# Patient Record
Sex: Male | Born: 1985 | Race: White | Hispanic: No | Marital: Single | Smoking: Former smoker
Health system: Southern US, Community
[De-identification: ages and names within clinical notes are randomized; demographics above are authoritative.]

## PROBLEM LIST (undated history)

## (undated) DIAGNOSIS — I8390 Asymptomatic varicose veins of unspecified lower extremity: Secondary | ICD-10-CM

## (undated) DIAGNOSIS — N183 Chronic kidney disease, stage 3 unspecified: Secondary | ICD-10-CM

## (undated) DIAGNOSIS — I10 Essential (primary) hypertension: Secondary | ICD-10-CM

## (undated) DIAGNOSIS — K5902 Outlet dysfunction constipation: Secondary | ICD-10-CM

## (undated) DIAGNOSIS — Q423 Congenital absence, atresia and stenosis of anus without fistula: Secondary | ICD-10-CM

## (undated) DIAGNOSIS — D899 Disorder involving the immune mechanism, unspecified: Secondary | ICD-10-CM

## (undated) DIAGNOSIS — Z9289 Personal history of other medical treatment: Secondary | ICD-10-CM

## (undated) DIAGNOSIS — J189 Pneumonia, unspecified organism: Secondary | ICD-10-CM

## (undated) DIAGNOSIS — Q605 Renal hypoplasia, unspecified: Secondary | ICD-10-CM

## (undated) DIAGNOSIS — Z940 Kidney transplant status: Secondary | ICD-10-CM

## (undated) DIAGNOSIS — Q7959 Other congenital malformations of abdominal wall: Secondary | ICD-10-CM

## (undated) DIAGNOSIS — E213 Hyperparathyroidism, unspecified: Secondary | ICD-10-CM

## (undated) DIAGNOSIS — Q602 Renal agenesis, unspecified: Secondary | ICD-10-CM

## (undated) DIAGNOSIS — D849 Immunodeficiency, unspecified: Secondary | ICD-10-CM

## (undated) HISTORY — PX: COLOSTOMY: SHX63

## (undated) HISTORY — PX: REPAIR IMPERFORATE ANUS / ANORECTOPLASTY: SUR1185

## (undated) HISTORY — PX: PYELOTOMY / PYELOSTOMY: SUR1062

## (undated) HISTORY — PX: APPENDECTOMY: SHX54

## (undated) HISTORY — PX: COLOSTOMY TAKEDOWN: SHX5783

---
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
## 2010-07-13 HISTORY — PX: PARATHYROIDECTOMY: SHX19

## 2013-01-31 ENCOUNTER — Emergency Department (HOSPITAL_COMMUNITY): Payer: BC Managed Care – PPO

## 2013-01-31 ENCOUNTER — Inpatient Hospital Stay (HOSPITAL_COMMUNITY)
Admission: EM | Admit: 2013-01-31 | Discharge: 2013-02-03 | DRG: 584 | Disposition: A | Discharge status: Home / Self Care | Payer: BC Managed Care – PPO | Attending: Internal Medicine | Admitting: Internal Medicine

## 2013-01-31 ENCOUNTER — Encounter (HOSPITAL_COMMUNITY): Payer: Self-pay | Admitting: Emergency Medicine

## 2013-01-31 DIAGNOSIS — E213 Hyperparathyroidism, unspecified: Secondary | ICD-10-CM | POA: Diagnosis present

## 2013-01-31 DIAGNOSIS — M1A00X0 Idiopathic chronic gout, unspecified site, without tophus (tophi): Secondary | ICD-10-CM | POA: Diagnosis present

## 2013-01-31 DIAGNOSIS — I498 Other specified cardiac arrhythmias: Secondary | ICD-10-CM | POA: Diagnosis present

## 2013-01-31 DIAGNOSIS — N390 Urinary tract infection, site not specified: Secondary | ICD-10-CM

## 2013-01-31 DIAGNOSIS — Z940 Kidney transplant status: Secondary | ICD-10-CM

## 2013-01-31 DIAGNOSIS — Z79899 Other long term (current) drug therapy: Secondary | ICD-10-CM

## 2013-01-31 DIAGNOSIS — I10 Essential (primary) hypertension: Secondary | ICD-10-CM | POA: Diagnosis present

## 2013-01-31 DIAGNOSIS — K5902 Outlet dysfunction constipation: Secondary | ICD-10-CM

## 2013-01-31 DIAGNOSIS — N183 Chronic kidney disease, stage 3 unspecified: Secondary | ICD-10-CM | POA: Diagnosis present

## 2013-01-31 DIAGNOSIS — K5900 Constipation, unspecified: Secondary | ICD-10-CM | POA: Diagnosis present

## 2013-01-31 DIAGNOSIS — M109 Gout, unspecified: Secondary | ICD-10-CM | POA: Diagnosis present

## 2013-01-31 DIAGNOSIS — R509 Fever, unspecified: Secondary | ICD-10-CM

## 2013-01-31 DIAGNOSIS — A419 Sepsis, unspecified organism: Principal | ICD-10-CM

## 2013-01-31 DIAGNOSIS — N12 Tubulo-interstitial nephritis, not specified as acute or chronic: Secondary | ICD-10-CM | POA: Diagnosis present

## 2013-01-31 DIAGNOSIS — N179 Acute kidney failure, unspecified: Secondary | ICD-10-CM | POA: Diagnosis present

## 2013-01-31 DIAGNOSIS — D72829 Elevated white blood cell count, unspecified: Secondary | ICD-10-CM

## 2013-01-31 DIAGNOSIS — R000 Tachycardia, unspecified: Secondary | ICD-10-CM

## 2013-01-31 DIAGNOSIS — IMO0002 Reserved for concepts with insufficient information to code with codable children: Secondary | ICD-10-CM

## 2013-01-31 DIAGNOSIS — Q602 Renal agenesis, unspecified: Secondary | ICD-10-CM

## 2013-01-31 DIAGNOSIS — M1A9XX0 Chronic gout, unspecified, without tophus (tophi): Secondary | ICD-10-CM | POA: Diagnosis present

## 2013-01-31 DIAGNOSIS — Q421 Congenital absence, atresia and stenosis of rectum without fistula: Secondary | ICD-10-CM

## 2013-01-31 DIAGNOSIS — I129 Hypertensive chronic kidney disease with stage 1 through stage 4 chronic kidney disease, or unspecified chronic kidney disease: Secondary | ICD-10-CM | POA: Diagnosis present

## 2013-01-31 DIAGNOSIS — D649 Anemia, unspecified: Secondary | ICD-10-CM | POA: Diagnosis present

## 2013-01-31 DIAGNOSIS — Z87891 Personal history of nicotine dependence: Secondary | ICD-10-CM

## 2013-01-31 HISTORY — DX: Immunodeficiency, unspecified: D84.9

## 2013-01-31 HISTORY — DX: Pneumonia, unspecified organism: J18.9

## 2013-01-31 HISTORY — DX: Essential (primary) hypertension: I10

## 2013-01-31 HISTORY — DX: Renal hypoplasia, unspecified: Q60.5

## 2013-01-31 HISTORY — DX: Kidney transplant status: Z94.0

## 2013-01-31 HISTORY — DX: Outlet dysfunction constipation: K59.02

## 2013-01-31 HISTORY — DX: Chronic kidney disease, stage 3 (moderate): N18.3

## 2013-01-31 HISTORY — DX: Renal agenesis, unspecified: Q60.2

## 2013-01-31 HISTORY — DX: Disorder involving the immune mechanism, unspecified: D89.9

## 2013-01-31 HISTORY — DX: Personal history of other medical treatment: Z92.89

## 2013-01-31 HISTORY — DX: Hyperparathyroidism, unspecified: E21.3

## 2013-01-31 HISTORY — DX: Chronic kidney disease, stage 3 unspecified: N18.30

## 2013-01-31 HISTORY — DX: Asymptomatic varicose veins of unspecified lower extremity: I83.90

## 2013-01-31 HISTORY — DX: Congenital absence, atresia and stenosis of anus without fistula: Q42.3

## 2013-01-31 HISTORY — DX: Other congenital malformations of abdominal wall: Q79.59

## 2013-01-31 LAB — URINE MICROSCOPIC-ADD ON

## 2013-01-31 LAB — CBC WITH DIFFERENTIAL/PLATELET
Basophils Absolute: 0 10*3/uL (ref 0.0–0.1)
Eosinophils Absolute: 0.1 10*3/uL (ref 0.0–0.7)
HCT: 38.1 % — ABNORMAL LOW (ref 39.0–52.0)
Lymphs Abs: 0.8 10*3/uL (ref 0.7–4.0)
MCHC: 33.6 g/dL (ref 30.0–36.0)
MCV: 92 fL (ref 78.0–100.0)
Neutro Abs: 9.4 10*3/uL — ABNORMAL HIGH (ref 1.7–7.7)
RDW: 13.4 % (ref 11.5–15.5)

## 2013-01-31 LAB — COMPREHENSIVE METABOLIC PANEL
Alkaline Phosphatase: 64 U/L (ref 39–117)
BUN: 29 mg/dL — ABNORMAL HIGH (ref 6–23)
Calcium: 9.7 mg/dL (ref 8.4–10.5)
GFR calc Af Amer: 49 mL/min — ABNORMAL LOW (ref >90)
GFR calc non Af Amer: 42 mL/min — ABNORMAL LOW (ref >90)
Glucose, Bld: 98 mg/dL (ref 70–99)
Potassium: 4.1 mEq/L (ref 3.5–5.1)
Total Protein: 7.5 g/dL (ref 6.0–8.3)

## 2013-01-31 LAB — CBC
HCT: 35.2 % — ABNORMAL LOW (ref 39.0–52.0)
MCHC: 33.5 g/dL (ref 30.0–36.0)
Platelets: 211 10*3/uL (ref 150–400)
RDW: 13.6 % (ref 11.5–15.5)

## 2013-01-31 LAB — URINALYSIS, ROUTINE W REFLEX MICROSCOPIC
Ketones, ur: NEGATIVE mg/dL
Nitrite: POSITIVE — AB
Urobilinogen, UA: 0.2 mg/dL (ref 0.0–1.0)
pH: 5.5 (ref 5.0–8.0)

## 2013-01-31 LAB — CG4 I-STAT (LACTIC ACID): Lactic Acid, Venous: 1.1 mmol/L (ref 0.5–2.2)

## 2013-01-31 LAB — CREATININE, SERUM
Creatinine, Ser: 2.01 mg/dL — ABNORMAL HIGH (ref 0.50–1.35)
GFR calc non Af Amer: 43 mL/min — ABNORMAL LOW (ref >90)

## 2013-01-31 MED ORDER — PREDNISONE 10 MG PO TABS
10.0000 mg | ORAL_TABLET | Freq: Every day | ORAL | Status: DC
  Administered 2013-02-01 – 2013-02-03 (×3): 10 mg via ORAL
  Filled 2013-01-31 (×4): qty 1

## 2013-01-31 MED ORDER — PIPERACILLIN-TAZOBACTAM 3.375 G IVPB
3.3750 g | Freq: Once | INTRAVENOUS | Status: AC
  Administered 2013-01-31: 3.375 g via INTRAVENOUS
  Filled 2013-01-31: qty 50

## 2013-01-31 MED ORDER — VANCOMYCIN HCL IN DEXTROSE 1-5 GM/200ML-% IV SOLN
1000.0000 mg | Freq: Once | INTRAVENOUS | Status: DC
  Filled 2013-01-31: qty 200

## 2013-01-31 MED ORDER — SODIUM CHLORIDE 0.9 % IV BOLUS (SEPSIS)
1000.0000 mL | Freq: Once | INTRAVENOUS | Status: AC
  Administered 2013-01-31: 1000 mL via INTRAVENOUS

## 2013-01-31 MED ORDER — SULFAMETHOXAZOLE-TRIMETHOPRIM 800-160 MG PO TABS
1.0000 | ORAL_TABLET | Freq: Once | ORAL | Status: DC
  Filled 2013-01-31: qty 1

## 2013-01-31 MED ORDER — ACETAMINOPHEN 325 MG PO TABS
650.0000 mg | ORAL_TABLET | Freq: Four times a day (QID) | ORAL | Status: DC | PRN
  Administered 2013-02-01: 650 mg via ORAL
  Filled 2013-01-31: qty 2

## 2013-01-31 MED ORDER — ONDANSETRON HCL 4 MG/2ML IJ SOLN: 4.0000 mg | Freq: Four times a day (QID) | INTRAMUSCULAR | Status: DC | PRN

## 2013-01-31 MED ORDER — PIPERACILLIN-TAZOBACTAM 3.375 G IVPB
3.3750 g | Freq: Three times a day (TID) | INTRAVENOUS | Status: DC
  Administered 2013-01-31 – 2013-02-02 (×6): 3.375 g via INTRAVENOUS
  Filled 2013-01-31 (×8): qty 50

## 2013-01-31 MED ORDER — LOSARTAN POTASSIUM 25 MG PO TABS
25.0000 mg | ORAL_TABLET | Freq: Every day | ORAL | Status: DC
  Administered 2013-01-31 – 2013-02-03 (×4): 25 mg via ORAL
  Filled 2013-01-31 (×4): qty 1

## 2013-01-31 MED ORDER — ENOXAPARIN SODIUM 40 MG/0.4ML ~~LOC~~ SOLN
40.0000 mg | SUBCUTANEOUS | Status: DC
  Administered 2013-01-31 – 2013-02-02 (×3): 40 mg via SUBCUTANEOUS
  Filled 2013-01-31 (×4): qty 0.4

## 2013-01-31 MED ORDER — TACROLIMUS 1 MG PO CAPS
4.0000 mg | ORAL_CAPSULE | Freq: Every day | ORAL | Status: DC
  Administered 2013-01-31 – 2013-02-02 (×3): 4 mg via ORAL
  Filled 2013-01-31 (×4): qty 4

## 2013-01-31 MED ORDER — ACETAMINOPHEN 650 MG RE SUPP: 650.0000 mg | Freq: Four times a day (QID) | RECTAL | Status: DC | PRN

## 2013-01-31 MED ORDER — SODIUM BICARBONATE 650 MG PO TABS
1300.0000 mg | ORAL_TABLET | Freq: Every day | ORAL | Status: DC
Start: 2013-01-31 — End: 2013-02-03
  Administered 2013-01-31 – 2013-02-03 (×4): 1300 mg via ORAL
  Filled 2013-01-31 (×4): qty 2

## 2013-01-31 MED ORDER — MYCOPHENOLATE MOFETIL 250 MG PO CAPS
1500.0000 mg | ORAL_CAPSULE | Freq: Two times a day (BID) | ORAL | Status: DC
  Administered 2013-01-31 – 2013-02-03 (×6): 1500 mg via ORAL
  Filled 2013-01-31 (×8): qty 6

## 2013-01-31 MED ORDER — SODIUM CHLORIDE 0.9 % IV SOLN
INTRAVENOUS | Status: AC
  Administered 2013-01-31 – 2013-02-01 (×2): via INTRAVENOUS

## 2013-01-31 MED ORDER — ALLOPURINOL 100 MG PO TABS
100.0000 mg | ORAL_TABLET | Freq: Every day | ORAL | Status: DC
  Administered 2013-02-01 – 2013-02-03 (×3): 100 mg via ORAL
  Filled 2013-01-31 (×4): qty 1

## 2013-01-31 MED ORDER — METOPROLOL TARTRATE 12.5 MG HALF TABLET
12.5000 mg | ORAL_TABLET | Freq: Two times a day (BID) | ORAL | Status: DC
  Administered 2013-01-31 – 2013-02-03 (×6): 12.5 mg via ORAL
  Filled 2013-01-31 (×7): qty 1

## 2013-01-31 MED ORDER — ONDANSETRON HCL 4 MG PO TABS: 4.0000 mg | ORAL_TABLET | Freq: Four times a day (QID) | ORAL | Status: DC | PRN

## 2013-01-31 MED ORDER — SODIUM CHLORIDE 0.9 % IV SOLN
INTRAVENOUS | Status: DC
  Administered 2013-01-31: 17:00:00 via INTRAVENOUS

## 2013-01-31 MED ORDER — ACETAMINOPHEN 325 MG PO TABS
650.0000 mg | ORAL_TABLET | Freq: Once | ORAL | Status: AC
  Administered 2013-01-31: 650 mg via ORAL
  Filled 2013-01-31: qty 2

## 2013-01-31 MED ORDER — TACROLIMUS 1 MG PO CAPS
3.0000 mg | ORAL_CAPSULE | Freq: Every day | ORAL | Status: DC
  Administered 2013-02-01 – 2013-02-03 (×3): 3 mg via ORAL
  Filled 2013-01-31 (×3): qty 3

## 2013-01-31 MED ORDER — SODIUM CHLORIDE 0.9 % IJ SOLN: 3.0000 mL | Freq: Two times a day (BID) | INTRAMUSCULAR | Status: DC

## 2013-01-31 NOTE — H&P (Signed)
[facility] History and Physical  Robert Bartlett NWG:[id number] DOB: 10-21-1985 DOA: 01/31/2013  Referring physician:  Loren Racer PCP:  No primary provider on file.    [facility], Dr. Rennis Harding, 2 weeks appointment already scheduled, new patient visit.  Chief Complaint:  Fever, back pain  HPI:  The patient is a 27 y.o. 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 Get a second kidney transplant, his donor was his mother, and developed rejection in 2012 which responded to Atgam, and he has been compliant with his immunosuppression.  His baseline creatinine is around 1.8 and he has had recurrent UTIs for the last 5 years.  He states his urinary tract infections usually respond to fluoroquinolone. His medical history also includes hypertension, hyperparathyroidism status post removal of one parathyroid gland, rectal dysmotility requiring daily enema a rectal disimpaction.    The patient states that he was at his baseline health 3 days ago. He developed some low back pain which got progressively worse. He developed high fevers to 101F this morning. He also had diffuse myalgias. He denies nausea, vomiting, diarrhea, or abdominal pain. He he firmly denies that he has had pain over his transplant. He has had dysuria without urgency or frequency. He has had some cough for the last 2 days without production of sputum or sinus symptoms. He denies sore throat or ear pain.  He states his symptoms are similar to when he had urinary tract infections in the past.  In the emergency department, he was tachycardic to the 140s and his heart rate trended down with IV fluids. His temperature was 101  Fahrenheit in trended down with Tylenol.  His labs demonstrates BUN of 29, creatinine 2.07, potassium 4.1, bicarbonate 20, white blood cell count 11.5, hemoglobin 12.8.  Urinalysis positive for greater than 300 protein, positive nitrite, large leukocyte esterase, too numerous to count white blood cells, rare squamous epithelials, and many bacteria.   Review of Systems:  Denies weight loss or gain, changes to hearing and vision.  Denies rhinorrhea, sinus congestion, sore throat.  Denies chest pain and palpitations.  Denies SOB, wheezing.  Denies nausea, vomiting, diarrhea.  Denies polyuria, polydipsia.  Denies hematemesis, blood in stools, melena, abnormal bruising or bleeding.  Denies lymphadenopathy.  Occasional right angle arthralgias.  Denies skin rash or ulcer.  Chronic mild edema of right lower extremity edema.  Denies focal numbness, weakness, slurred speech, confusion, facial droop.  Denies anxiety and depression.    Past Medical History  Diagnosis Date  . H/O kidney transplant     1st in 1995, rejected and failed.  2nd in 2003 from mother still working.  Had rejection two years ago Atgam which stopped rejection  . Hypertension   . Hyperparathyroidism     removed 1 parathyroid gland with resolution  . 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 Imperforate anus   . Congenital absence of abdominal musculature   . Congenital renal agenesis and dysgenesis   . Chronic kidney disease, stage 3   . Gout   . Immunosuppression    Past Surgical History  Procedure Laterality Date  . Repair imperforate anus /  anorectoplasty    . Kidney transplant  1995  . Kidney transplant  2003    living donor, mother  . Tonsillectomy    . Appendectomy    . Pyelotomy / pyelostomy      s/p take down in 2003  . Colostomy      s/p reversal   Social History:  reports that he quit smoking about 10 years ago. His smoking use included Cigarettes. He smoked 0.10  packs per day. He does not have any smokeless tobacco history on file. He reports that  drinks alcohol. He reports that he does not use illicit drugs. Lives in a house in [place].  Does not get any home health services.  Completes ADLs without assistance.    Allergies  Allergen Reactions  . Aspirin     Can not take due to kidney transplant  . Vancomycin     Vein redness and swelling on past administration  . Azithromycin Rash    Family History  Problem Relation Age of Onset  . High blood pressure Father   . Stroke Paternal Grandfather   . Kidney failure Neg Hx      Prior to Admission medications   Medication Sig Start Date End Date Taking? Authorizing Provider  allopurinol (ZYLOPRIM) 100 MG tablet Take 100 mg by mouth daily.   Yes Historical Provider, MD  losartan (COZAAR) 25 MG tablet Take 25 mg by mouth daily.   Yes Historical Provider, MD  metoprolol tartrate (LOPRESSOR) 25 MG tablet Take 12.5 mg by mouth daily.   Yes Historical Provider, MD  mycophenolate (CELLCEPT) 500 MG tablet Take 1,500 mg by mouth 2 (two) times daily.   Yes Historical Provider, MD  predniSONE (DELTASONE) 10 MG tablet Take 10 mg by mouth daily.   Yes Historical Provider, MD  sodium bicarbonate 650 MG tablet Take 1,300 mg by mouth daily.   Yes Historical Provider, MD  sulfamethoxazole-trimethoprim (BACTRIM DS,SEPTRA DS) 800-160 MG per tablet Take 1 tablet by mouth once. Monday,wed,friday   Yes Historical Provider, MD  tacrolimus (PROGRAF) 1 MG capsule Take 3-4 mg by mouth 2 (two) times daily. Take 3mg  in the morning and 4mg  in the evening   Yes Historical Provider, MD   Physical Exam: Filed Vitals:   01/31/13 1348 01/31/13 1408 01/31/13 1515 01/31/13 1657  BP: 128/95 121/78  116/75  Pulse: 144 142  90  Temp: 100.4 F (38 C)  101 F (38.3 C) 99.2 F (37.3 C)  TempSrc: Oral  Oral Oral  Resp: 18   18  SpO2: 99% 100%  98%     General:  Caucasian male, no acute distress, mildly diaphoretic  Eyes:   PERRL, anicteric, non-injected.  ENT:  Nares clear.  OP clear, non-erythematous without plaques or exudates.  MMM.  Neck:  Supple without TM or JVD.    Lymph:  No cervical, supraclavicular, or submandibular LAD.  Cardiovascular:  Tachycardic RR, normal S1, S2, without m/r/g.  2+ pulses, warm extremities  Respiratory:  CTA bilaterally without increased WOB.  Abdomen:  NABS.  Soft, ND/NT.  Palpable kidney in the left lower quadrant, nontender  Skin:  No rashes or focal lesions.  Musculoskeletal:  Normal bulk and tone.  No LE edema.  Psychiatric:  A & O x 4.  Appropriate affect.  Neurologic:  CN 3-12 intact.  5/5 strength.  Sensation intact.  Labs on Admission:  Basic Metabolic Panel:  Recent Labs Lab 01/31/13 1409  NA 137  K 4.1  CL 103  CO2 20  GLUCOSE 98  BUN 29*  CREATININE 2.07*  CALCIUM 9.7   Liver Function Tests:  Recent Labs Lab 01/31/13 1409  AST 16  ALT 12  ALKPHOS 64  BILITOT 0.9  PROT 7.5  ALBUMIN 4.5   No results found for this basename: LIPASE, AMYLASE,  in the last 168 hours No results found for this basename: AMMONIA,  in the last 168 hours CBC:  Recent Labs Lab 01/31/13 1409  WBC 11.5*  NEUTROABS 9.4*  HGB 12.8*  HCT 38.1*  MCV 92.0  PLT 239   Cardiac Enzymes: No results found for this basename: CKTOTAL, CKMB, CKMBINDEX, TROPONINI,  in the last 168 hours  BNP (last 3 results) No results found for this basename: PROBNP,  in the last 8760 hours CBG: No results found for this basename: GLUCAP,  in the last 168 hours  Radiological Exams on Admission: Dg Chest 2 View  01/31/2013   *RADIOLOGY REPORT*  Clinical Data: Fever.  CHEST - 2 VIEW  Comparison: None.  Findings: No pulmonary infiltrates, edema or pleural fluid are identified.  Cardiac and mediastinal contours are within normal limits.  There is a severe scoliosis of the thoracic spine.  IMPRESSION: No acute findings.   Original Report Authenticated By: Irish Lack, M.D.     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  He is already exposed, 2.  Immunosuppressed -  Narrow as soon as possible -  F/u urine culture -  F/u blood culture    Renal transplant with CKD stage 3, creatinine near baseline, compliant with medications -  Hydrate -  Repeat in AM -  Minimize nephrotoxins -  Continue immunosuppression with bactrim PCP prophylaxis -  Check tacrolimus level  Sinus tachycardia, likely due to fever, dehydration, sepsis -  Hydration and abx  Cough, mild and CXR negative for PNA.  Follow  Gout, chronic, stable. 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 Trend WBC  Normocytic anemia, possibly due to renal parenchymal disease or marrow suppression from acute illness -  Trend hemoglobin -  Transfuse for hemoglobin less than 7  Diet:  Healthy heart Access:  PIV IVF:  Normal saline at 100 mL per hour x12 hours Proph:  Lovenox  Code Status:  Full code Family Communication:  Spoke with the patient and his girlfriend Kennith Center, who is his primary emergency contact Disposition Plan:  Admit to telemetry  Time spent: 60 min  Renae Fickle [facility] Pager [phone]  If 7PM-7AM, please contact night-coverage [email or web address] Password [facility] 01/31/2013, 6:07 PM      Social worker to verify insurance.

## 2013-01-31 NOTE — ED Provider Notes (Signed)
History    CSN: [id number] Arrival date & time 01/31/13  1344  First MD Initiated Contact with Patient 01/31/13 1358     Chief Complaint  Patient presents with  . Fever   (Consider location/radiation/quality/duration/timing/severity/associated sxs/prior Treatment) HPI Pt with history of renal transplant x 2, [place] does not have transplant MD in [place]. Is waiting to be seen at [facility]. Pt with dysuria x 2 days and fever and chills this morning. +diffuse myalgias especially in lower back. No N/V/D. +mild suprapubic tenderness. Pt states symptoms are similar with previous UTI Past Medical History  Diagnosis Date  . Renal failure   . H/O kidney transplant   . Hypertension    History reviewed. No pertinent past surgical history. History reviewed. No pertinent family history. History  Substance Use Topics  . Smoking status: Never Smoker   . Smokeless tobacco: Not on file  . Alcohol Use: Yes     Comment: occ    Review of Systems  Constitutional: Positive for fever, chills and fatigue.  HENT: Negative for neck pain and neck stiffness.   Respiratory: Negative for cough, shortness of breath and wheezing.   Gastrointestinal: Positive for abdominal pain. Negative for nausea, vomiting, diarrhea and constipation.  Genitourinary: Positive for dysuria. Negative for hematuria.  Musculoskeletal: Positive for myalgias and back pain.  Skin: Negative for rash and wound.  Neurological: Negative for dizziness, weakness, light-headedness, numbness and headaches.  All other systems reviewed and are negative.    Allergies  Aspirin and Azithromycin  Home Medications   Current Outpatient Rx  Name  Route  Sig  Dispense  Refill  . allopurinol (ZYLOPRIM) 100 MG tablet   Oral   Take 100 mg by mouth daily.         Marland Kitchen losartan (COZAAR) 25 MG tablet   Oral   Take 25 mg by mouth daily.         . metoprolol tartrate (LOPRESSOR) 25 MG tablet   Oral   Take 12.5 mg by mouth  daily.         . mycophenolate (CELLCEPT) 500 MG tablet   Oral   Take 1,500 mg by mouth 2 (two) times daily.         . predniSONE (DELTASONE) 10 MG tablet   Oral   Take 10 mg by mouth daily.         . sodium bicarbonate 650 MG tablet   Oral   Take 1,300 mg by mouth daily.         Marland Kitchen sulfamethoxazole-trimethoprim (BACTRIM DS,SEPTRA DS) 800-160 MG per tablet   Oral   Take 1 tablet by mouth once. Monday,wed,friday         . tacrolimus (PROGRAF) 1 MG capsule   Oral   Take 3-4 mg by mouth 2 (two) times daily. Take 3mg  in the morning and 4mg  in the evening          BP 128/95  Pulse 144  Temp(Src) 100.4 F (38 C) (Oral)  Resp 18  SpO2 99% Physical Exam  Nursing note and vitals reviewed. Constitutional: He is oriented to person, place, and time. He appears well-developed and well-nourished. No distress.  HENT:  Head: Normocephalic and atraumatic.  Mouth/Throat: Oropharynx is clear and moist.  Eyes: EOM are normal. Pupils are equal, round, and reactive to light.  Neck: Normal range of motion. Neck supple.  Cardiovascular: Normal rate and regular rhythm.   Pulmonary/Chest: Effort normal and breath sounds normal. No  respiratory distress. He has no wheezes. He has no rales.  Abdominal: Soft. Bowel sounds are normal. He exhibits no distension and no mass. There is no tenderness. There is no rebound and no guarding.  Musculoskeletal: Normal range of motion. He exhibits tenderness. He exhibits no edema.  Mild diffuse lumbar tenderness  Neurological: He is alert and oriented to person, place, and time.  5/5 motor in all ext, sensation intact  Skin: Skin is warm and dry. No rash noted. No erythema.  Psychiatric: He has a normal mood and affect. His behavior is normal.    ED Course  Procedures (including critical care time) Labs Reviewed  URINE CULTURE  CULTURE, BLOOD (ROUTINE X 2)  CULTURE, BLOOD (ROUTINE X 2)  CBC WITH DIFFERENTIAL  COMPREHENSIVE METABOLIC PANEL   URINALYSIS, ROUTINE W REFLEX MICROSCOPIC   No results found. No diagnosis found.  MDM  Pt states Cr normally 1.8. Will discuss with [facility] transplant team.  Discussed with [facility]ist, Dr Vella Raring (cell [phone]). Suggested inpt observation and repeat labs in AM. Stated pt is Ok to be admitted to medicine service at [facility]. Suggest Prograf level in AM.  [facility] will admit for observation  Loren Racer, MD 01/31/13 1635

## 2013-01-31 NOTE — ED Notes (Signed)
Pt c/o fever and UTI sx; pt with hx of kidney transplant x 2; pt sts hx of similar c/o lower abd pain and groin pain

## 2013-01-31 NOTE — Progress Notes (Signed)
ANTIBIOTIC CONSULT NOTE - INITIAL  Pharmacy Consult for Zosyn Indication: pyelonephritis in immunocompromised pt (kidney transplant)  Allergies  Allergen Reactions  . Aspirin     Can not take due to kidney transplant  . Vancomycin     Vein redness and swelling on past administration  . Azithromycin Rash    Patient Measurements: Height: 5\' 11"  (180.3 cm) Weight: 189 lb 9.5 oz (86 kg) IBW/kg (Calculated) : 75.3 Adjusted Body Weight:   Vital Signs: Temp: 99.4 F (37.4 C) (07/22 1811) Temp src: Oral (07/22 1657) BP: 130/87 mmHg (07/22 1811) Pulse Rate: 99 (07/22 1811) Intake/Output from previous day:   Intake/Output from this shift:    Labs:  Recent Labs  01/31/13 1409 01/31/13 1822  WBC 11.5* 11.7*  HGB 12.8* 11.8*  PLT 239 211  CREATININE 2.07* 2.01*   Estimated Creatinine Clearance: 58.3 ml/min (by C-G formula based on Cr of 2.01). No results found for this basename: VANCOTROUGH, VANCOPEAK, VANCORANDOM, GENTTROUGH, GENTPEAK, GENTRANDOM, TOBRATROUGH, TOBRAPEAK, TOBRARND, AMIKACINPEAK, AMIKACINTROU, AMIKACIN,  in the last 72 hours   Microbiology: No results found for this or any previous visit (from the past 720 hour(s)).  Medical History: Past Medical History  Diagnosis Date  . H/O kidney transplant     1st in 1995, rejected and failed.  2nd in 2003 from mother still working.  Had rejection two years ago Atgam which stopped rejection  . Hypertension   . Hyperparathyroidism     removed 1 parathyroid gland with resolution  . 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 Imperforate anus   . Congenital absence of abdominal musculature   . Congenital renal agenesis and dysgenesis   . Chronic kidney disease, stage 3   . Gout   . Immunosuppression     Medications:  Scheduled:  . allopurinol  100 mg Oral Daily  . enoxaparin (LOVENOX) injection  40 mg Subcutaneous Q24H  . losartan  25 mg Oral Daily  . metoprolol  tartrate  12.5 mg Oral BID  . mycophenolate  1,500 mg Oral BID  . piperacillin-tazobactam (ZOSYN)  IV  3.375 g Intravenous Q8H  . predniSONE  10 mg Oral Daily  . sodium bicarbonate  1,300 mg Oral Daily  . sodium chloride  3 mL Intravenous Q12H  . sulfamethoxazole-trimethoprim  1 tablet Oral Once  . [START ON 02/01/2013] tacrolimus  3 mg Oral Daily  . tacrolimus  4 mg Oral QHS   Assessment: 27 yr old male with congenital anomalies (absence of the bowel wall, renal dysplasia) and kidney transplants x 2.He has recurrent UTIs and claims that his symptoms are typical of his previous UTI's. He received one dose of Zosyn 3.375 Gm in the ED.  Goal of Therapy:  Resolution of infection.  Plan:  Zosyn 3.375 Gm IV q8h.  F/U culture sensitivities.  Eugene Garnet 01/31/2013,7:11 PM

## 2013-01-31 NOTE — Progress Notes (Signed)
01/31/13.1800. nsg To unit 6700 per stretcher accompanied by RN alert and oriented patient; friend in room as well. Placed patient on telemetry per order; IVF ongoing. No skin issues noted. Oriented to unit set up. Fall prevention plan reviewed and signed.

## 2013-01-31 NOTE — ED Notes (Signed)
Patient transported to X-ray 

## 2013-02-01 DIAGNOSIS — D899 Disorder involving the immune mechanism, unspecified: Secondary | ICD-10-CM

## 2013-02-01 DIAGNOSIS — N12 Tubulo-interstitial nephritis, not specified as acute or chronic: Secondary | ICD-10-CM

## 2013-02-01 DIAGNOSIS — I498 Other specified cardiac arrhythmias: Secondary | ICD-10-CM

## 2013-02-01 LAB — CBC
HCT: 35.3 % — ABNORMAL LOW (ref 39.0–52.0)
MCHC: 33.4 g/dL (ref 30.0–36.0)
Platelets: 212 10*3/uL (ref 150–400)
RDW: 13.6 % (ref 11.5–15.5)

## 2013-02-01 LAB — BASIC METABOLIC PANEL
BUN: 28 mg/dL — ABNORMAL HIGH (ref 6–23)
Calcium: 9.4 mg/dL (ref 8.4–10.5)
Chloride: 109 mEq/L (ref 96–112)
Creatinine, Ser: 2.05 mg/dL — ABNORMAL HIGH (ref 0.50–1.35)
GFR calc Af Amer: 49 mL/min — ABNORMAL LOW (ref >90)

## 2013-02-01 MED ORDER — SODIUM CHLORIDE 0.9 % IV SOLN
INTRAVENOUS | Status: DC
  Administered 2013-02-01 – 2013-02-02 (×3): via INTRAVENOUS

## 2013-02-01 NOTE — Progress Notes (Signed)
CM Referral: patient needs to find a PCP Spoke with patient regarding PCP follow up.   He has [facility].  NCM suggested he contact [employer] customer service for a listing, provided number for [employer]

## 2013-02-01 NOTE — Progress Notes (Signed)
[facility] PROGRESS NOTE  Robert Bartlett ZOX:[id number] DOB: 02-Jun-1986 DOA: 01/31/2013 PCP: No primary provider on file.  Assessment/Plan: Sepsis secondary to Pyelonephritis with fever, tachycardia, sepsis in the setting of immunosuppression  - Started on zosyn in the ER which I will continue because 1. He is already exposed, 2. Immunosuppressed  - Narrow as soon as possible  - F/u urine culture--GNR - F/u blood culture--NGTD -WBC decreasing Renal transplant with CKD stage 3, creatinine near baseline, compliant with medications  -Patient states that baseline creatinine around 1.8 - Hydrate--continue IV fluids - Repeat in AM  - Minimize nephrotoxins  - Continue immunosuppression  - Check tacrolimus level  Sinus tachycardia, likely due to fever, dehydration, sepsis  - Improving with Hydration and abx  Cough, mild and CXR negative for PNA. Follow  Gout, chronic, stable. 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 Trend WBC  Normocytic anemia, possibly due to renal parenchymal disease or marrow suppression from acute illness  - Trend hemoglobin  - Transfuse for hemoglobin less than 7  Diet: Healthy heart  Access: PIV  IVF: Normal saline at 100 mL per hour x12 hours  Proph: Lovenox  Code Status: Full code   Family Communication:   Pt at beside Disposition Plan:   Home when medically stable    Antibiotics:  Zosyn 01/31/2013>>>    Procedures/Studies: Dg Chest 2 View  01/31/2013   *RADIOLOGY REPORT*  Clinical Data: Fever.  CHEST - 2 VIEW  Comparison: None.  Findings: No pulmonary infiltrates, edema or pleural fluid are identified.  Cardiac and mediastinal contours are within normal limits.  There is a severe scoliosis of the thoracic spine.   IMPRESSION: No acute findings.   Original Report Authenticated By: Irish Lack, M.D.         Subjective: Patient is feeling much better today. He denies any fevers, chills, chest discomfort, shortness breath, nausea, vomiting, diarrhea, abdominal pain, dysuria, hematuria.  Objective: Filed Vitals:   01/31/13 2209 02/01/13 0645 02/01/13 0937 02/01/13 1404  BP: 134/96 135/93 132/84 132/77  Pulse: 91 102 80 73  Temp: 98.8 F (37.1 C) 100.5 F (38.1 C) 99 F (37.2 C) 98.3 F (36.8 C)  TempSrc: Oral Oral Oral   Resp: 20 18 18 18   Height: 5\' 11"  (1.803 m)     Weight: 86.5 kg (190 lb 11.2 oz)     SpO2: 93% 100% 95% 95%    Intake/Output Summary (Last 24 hours) at 02/01/13 1641 Last data filed at 02/01/13 1406  Gross per 24 hour  Intake   1720 ml  Output   1075 ml  Net    645 ml   Weight change:  Exam:   General:  Pt is alert, follows commands appropriately, not in acute distress  HEENT: No icterus, No thrush, [place]/AT  Cardiovascular: RRR, S1/S2, no rubs, no gallops  Respiratory: CTA bilaterally, no wheezing, no crackles, no rhonchi  Abdomen: Soft/+BS, non tender, non distended, no guarding; no tenderness over transplant kidney site  Extremities: trace edema, No lymphangitis, No petechiae, No rashes, no synovitis  Data Reviewed: Basic Metabolic Panel:  Recent Labs Lab 01/31/13 1409 01/31/13 1822 02/01/13 0630  NA 137  --  139  K 4.1  --  4.2  CL 103  --  109  CO2 20  --  20  GLUCOSE  98  --  92  BUN 29*  --  28*  CREATININE 2.07* 2.01* 2.05*  CALCIUM 9.7  --  9.4   Liver Function Tests:  Recent Labs Lab 01/31/13 1409  AST 16  ALT 12  ALKPHOS 64  BILITOT 0.9  PROT 7.5  ALBUMIN 4.5   No results found for this basename: LIPASE, AMYLASE,  in the last 168 hours No results found for this basename: AMMONIA,  in the last 168 hours CBC:  Recent Labs Lab 01/31/13 1409 01/31/13 1822 02/01/13 0630  WBC 11.5* 11.7* 10.4  NEUTROABS 9.4*  --   --    HGB 12.8* 11.8* 11.8*  HCT 38.1* 35.2* 35.3*  MCV 92.0 93.1 93.9  PLT 239 211 212   Cardiac Enzymes: No results found for this basename: CKTOTAL, CKMB, CKMBINDEX, TROPONINI,  in the last 168 hours BNP: No components found with this basename: POCBNP,  CBG: No results found for this basename: GLUCAP,  in the last 168 hours  Recent Results (from the past 240 hour(s))  CULTURE, BLOOD (ROUTINE X 2)     Status: None   Collection Time    01/31/13  2:14 PM      Result Value Range Status   Specimen Description LEFT ANTECUBITAL   Final   Special Requests BOTTLES DRAWN AEROBIC ONLY 10CC   Final   Culture  Setup Time 01/31/2013 21:42   Final   Culture     Final   Value:        BLOOD CULTURE RECEIVED NO GROWTH TO DATE CULTURE WILL BE HELD FOR 5 DAYS BEFORE ISSUING A FINAL NEGATIVE REPORT   Report Status PENDING   Incomplete  CULTURE, BLOOD (ROUTINE X 2)     Status: None   Collection Time    01/31/13  2:26 PM      Result Value Range Status   Specimen Description RIGHT ANTECUBITAL   Final   Special Requests BOTTLES DRAWN AEROBIC ONLY 10CC   Final   Culture  Setup Time 01/31/2013 21:42   Final   Culture     Final   Value:        BLOOD CULTURE RECEIVED NO GROWTH TO DATE CULTURE WILL BE HELD FOR 5 DAYS BEFORE ISSUING A FINAL NEGATIVE REPORT   Report Status PENDING   Incomplete  URINE CULTURE     Status: None   Collection Time    01/31/13  2:36 PM      Result Value Range Status   Specimen Description URINE, CLEAN CATCH   Final   Special Requests NONE   Final   Culture  Setup Time 01/31/2013 16:17   Final   Colony Count >=100,000 COLONIES/ML   Final   Culture GRAM NEGATIVE RODS   Final   Report Status PENDING   Incomplete     Scheduled Meds: . allopurinol  100 mg Oral Daily  . enoxaparin (LOVENOX) injection  40 mg Subcutaneous Q24H  . losartan  25 mg Oral Daily  . metoprolol tartrate  12.5 mg Oral BID  . mycophenolate  1,500 mg Oral BID  . piperacillin-tazobactam (ZOSYN)  IV  3.375 g  Intravenous Q8H  . predniSONE  10 mg Oral Daily  . sodium bicarbonate  1,300 mg Oral Daily  . sodium chloride  3 mL Intravenous Q12H  . tacrolimus  3 mg Oral Daily  . tacrolimus  4 mg Oral QHS   Continuous Infusions:    Terryann Verbeek, DO  [facility] Pager [phone]  If 7PM-7AM, please contact night-coverage [email or web address] Password [facility] 02/01/2013, 4:41 PM   LOS: 1 day

## 2013-02-02 DIAGNOSIS — Z940 Kidney transplant status: Secondary | ICD-10-CM

## 2013-02-02 LAB — CBC
HCT: 32.7 % — ABNORMAL LOW (ref 39.0–52.0)
MCH: 30.6 pg (ref 26.0–34.0)
MCHC: 32.4 g/dL (ref 30.0–36.0)
RDW: 13.6 % (ref 11.5–15.5)

## 2013-02-02 LAB — BASIC METABOLIC PANEL
BUN: 24 mg/dL — ABNORMAL HIGH (ref 6–23)
Calcium: 9.6 mg/dL (ref 8.4–10.5)
GFR calc Af Amer: 50 mL/min — ABNORMAL LOW (ref >90)
GFR calc non Af Amer: 43 mL/min — ABNORMAL LOW (ref >90)
Potassium: 4 mEq/L (ref 3.5–5.1)

## 2013-02-02 LAB — URINE CULTURE

## 2013-02-02 MED ORDER — CIPROFLOXACIN HCL 500 MG PO TABS
500.0000 mg | ORAL_TABLET | Freq: Two times a day (BID) | ORAL | Status: DC
  Administered 2013-02-02 – 2013-02-03 (×2): 500 mg via ORAL
  Filled 2013-02-02 (×4): qty 1

## 2013-02-02 NOTE — Consult Note (Signed)
Referring Provider: No ref. provider found Primary Care Physician:  No primary provider on file. 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 Cadaveric kidney eventually failed and lead to a second renal transplant in 2003 donated from mother. Transplants performed in [place]. Had one episode of rejection in 2012 in [place] treated aggressively with ATGAM after patient stopped his immunosuppression. Baseline Creatinine 1.8 . Developed Acute kidney injury in setting of pyelonephritis. Has appointment in [facility] but would like a community nephrologist to co-manage. Is relocating to [facility] for employment.  Past Medical History  Diagnosis Date  . H/O kidney transplant     1st in 1995, rejected and failed.  2nd in 2003 from mother still working.  Had rejection two years ago Atgam which stopped rejection  . Hypertension   . Hyperparathyroidism     removed 1 parathyroid gland with resolution  . 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 Imperforate anus   . Congenital absence of abdominal musculature   . Congenital renal agenesis and dysgenesis   . Chronic kidney disease, stage 3   . Immunosuppression   . Pneumonia ~ 2000  . History of blood transfusion     "w/OR's" (01/31/2013)  . Varicose veins     "right ankle" (01/31/2013)    Past Surgical History  Procedure Laterality Date  . Repair imperforate anus / anorectoplasty  1980's  . Kidney transplant Right 1995  . Kidney transplant Left 2003    living donor, mother  . Pyelotomy / pyelostomy      s/p take down in 2003  . Colostomy  1980's  . Tonsillectomy  1990's  . Appendectomy  1980's  . Colostomy takedown  1980's    "only had it a couple years" (01/31/2013)  . Parathyroidectomy Right 2012    "for high  calcium" (01/31/2013)    Prior to Admission medications   Medication Sig Start Date End Date Taking? Authorizing Provider  allopurinol (ZYLOPRIM) 100 MG tablet Take 100 mg by mouth daily.   Yes Historical Provider, MD  losartan (COZAAR) 25 MG tablet Take 25 mg by mouth daily.   Yes Historical Provider, MD  metoprolol tartrate (LOPRESSOR) 25 MG tablet Take 12.5 mg by mouth 2 (two) times daily.    Yes Historical Provider, MD  mycophenolate (CELLCEPT) 500 MG tablet Take 1,500 mg by mouth 2 (two) times daily.   Yes Historical Provider, MD  predniSONE (DELTASONE) 10 MG tablet Take 10 mg by mouth daily.   Yes Historical Provider, MD  Rubber [employer]s (ENEMA BOTTLE) MISC by Does not apply route. Tap water enema once daily with disimpaction daily.   Yes Historical Provider, MD  sodium bicarbonate 650 MG tablet Take 1,300 mg by mouth daily.   Yes Historical Provider, MD  sulfamethoxazole-trimethoprim (BACTRIM DS,SEPTRA DS) 800-160 MG per tablet Take 1 tablet by mouth once. Monday,wed,friday   Yes Historical Provider, MD  tacrolimus (PROGRAF) 1 MG capsule Take 3-4 mg by mouth 2 (two) times daily. Take 3mg  in the morning and 4mg  in the evening   Yes Historical Provider, MD    Current Facility-Administered Medications  Medication Dose Route Frequency Provider Last Rate Last Dose  . 0.9 %  sodium chloride infusion   Intravenous Continuous Catarina Hartshorn, MD 100 mL/hr at 02/02/13 0947    .  acetaminophen (TYLENOL) tablet 650 mg  650 mg Oral Q6H PRN Renae Fickle, MD   650 mg at 02/01/13 9604   Or  . acetaminophen (TYLENOL) suppository 650 mg  650 mg Rectal Q6H PRN Renae Fickle, MD      . allopurinol (ZYLOPRIM) tablet 100 mg  100 mg Oral Daily Renae Fickle, MD   100 mg at 02/02/13 0948  . ciprofloxacin (CIPRO) tablet 500 mg  500 mg Oral BID Catarina Hartshorn, MD      . enoxaparin (LOVENOX) injection 40 mg  40 mg Subcutaneous Q24H Renae Fickle, MD   40 mg at 02/02/13 1754  . losartan (COZAAR) tablet 25 mg  25 mg  Oral Daily Renae Fickle, MD   25 mg at 02/02/13 0948  . metoprolol tartrate (LOPRESSOR) tablet 12.5 mg  12.5 mg Oral BID Renae Fickle, MD   12.5 mg at 02/02/13 0948  . mycophenolate (CELLCEPT) capsule 1,500 mg  1,500 mg Oral BID Renae Fickle, MD   1,500 mg at 02/02/13 0947  . ondansetron (ZOFRAN) tablet 4 mg  4 mg Oral Q6H PRN Renae Fickle, MD       Or  . ondansetron [facility]) injection 4 mg  4 mg Intravenous Q6H PRN Renae Fickle, MD      . predniSONE (DELTASONE) tablet 10 mg  10 mg Oral Daily Renae Fickle, MD   10 mg at 02/02/13 0948  . sodium bicarbonate tablet 1,300 mg  1,300 mg Oral Daily Renae Fickle, MD   1,300 mg at 02/02/13 0948  . sodium chloride 0.9 % injection 3 mL  3 mL Intravenous Q12H Renae Fickle, MD      . tacrolimus (PROGRAF) capsule 3 mg  3 mg Oral Daily Renae Fickle, MD   3 mg at 02/02/13 0948  . tacrolimus (PROGRAF) capsule 4 mg  4 mg Oral QHS Renae Fickle, MD   4 mg at 02/01/13 2206    Allergies as of 01/31/2013 - Review Complete 01/31/2013  Allergen Reaction Noted  . Aspirin  01/31/2013  . Vancomycin  01/31/2013  . Azithromycin Rash 01/31/2013    Family History  Problem Relation Age of Onset  . High blood pressure Father   . Stroke Paternal Grandfather   . Kidney failure Neg Hx     History   Social History  . Marital Status: Single    Spouse Name: N/A    Number of Children: N/A  . Years of Education: N/A   Occupational History  . truck [employer]    Social History Main Topics  . Smoking status: Former Smoker -- 0.10 packs/day for .1 years    Types: Cigarettes    Quit date: 02/01/2003  . Smokeless tobacco: Never Used  . Alcohol Use: 1.8 oz/week    3 Cans of beer per week     Comment: 01/31/2013 "once/wk I'll have 2-3 beers"  . Drug Use: No  . Sexually Active: Yes   Other Topics Concern  . Not on file   Social History Narrative   Lives in a house in [place].  Does not get any home health services.  Completes ADLs  without assistance.      Review of Systems: Gen: positive fever, chills, sweats, anorexia, fatigue, weakness, malaise, weight loss, and sleep disorder HEENT: No visual complaints, No history of Retinopathy. Normal external appearance No Epistaxis or Sore throat. No sinusitis.   CV: Denies chest pain, angina, palpitations, syncope, orthopnea, PND, peripheral edema, and claudication. Resp: Denies dyspnea at rest, dyspnea with exercise,  cough, sputum, wheezing, coughing up blood, and pleurisy. GI: Denies vomiting blood, jaundice, and fecal incontinence.   Denies dysphagia or odynophagia. GU : Pain over allograft  MS: Denies joint pain, limitation of movement, and swelling, stiffness, low back pain, extremity pain. Denies muscle weakness, cramps, atrophy.  No use of non steroidal antiinflammatory drugs. Derm: Denies rash, itching, dry skin, hives, moles, warts, or unhealing ulcers.  Psych: Denies depression, anxiety, memory loss, suicidal ideation, hallucinations, paranoia, and confusion. Heme: Denies bruising, bleeding, and enlarged lymph nodes. Neuro: No headache.  No diplopia. No dysarthria.  No dysphasia.  No history of CVA.  No Seizures. No paresthesias.  No weakness. Endocrine No DM.  No Thyroid disease.  No Adrenal disease.  Physical Exam: Vital signs in last 24 hours: Temp:  [98 F (36.7 C)-98.5 F (36.9 C)] 98 F (36.7 C) (07/24 1659) Pulse Rate:  [50-64] 53 (07/24 1659) Resp:  [18] 18 (07/24 1659) BP: (117-145)/(77-100) 119/80 mmHg (07/24 1659) SpO2:  [97 %-100 %] 97 % (07/24 1659) Weight:  [85.7 kg (188 lb 15 oz)] 85.7 kg (188 lb 15 oz) (07/23 2136) Last BM Date: 02/01/13 General:   Alert,  Well-developed, well-nourished, pleasant and cooperative in NAD Head:  Normocephalic and atraumatic. Eyes:  Sclera clear, no icterus.   Conjunctiva pink. Ears:  Normal auditory acuity. Nose:  No deformity, discharge,  or lesions. Mouth:  No deformity or lesions, dentition normal. Neck:   Supple; no masses or thyromegaly. JVP not elevated Lungs:  Clear throughout to auscultation.   No wheezes, crackles, or rhonchi. No acute distress. Heart:  Regular rate and rhythm; no murmurs, clicks, rubs,  or gallops. Abdomen:  Soft, nontender and nondistended. No masses, hepatosplenomegaly or hernias noted. Normal bowel sounds, without guarding, and without rebound.   Msk:  Symmetrical without gross deformities. Normal posture. Pulses:  No carotid, renal, femoral bruits. DP and PT symmetrical and equal Extremities:  Without clubbing or edema. Neurologic:  Alert and  oriented x4;  grossly normal neurologically. Skin:  Intact without significant lesions or rashes. Cervical Nodes:  No significant cervical adenopathy. Psych:  Alert and cooperative. Normal mood and affect.  Intake/Output from previous day: 07/23 0701 - 07/24 0700 In: 1735 [P.O.:360; I.V.:1275; IV Piggyback:100] Out: -  Intake/Output this shift:    Lab Results:  Recent Labs  01/31/13 1822 02/01/13 0630 02/02/13 0430  WBC 11.7* 10.4 8.9  HGB 11.8* 11.8* 10.6*  HCT 35.2* 35.3* 32.7*  PLT 211 212 216   BMET  Recent Labs  01/31/13 1409 01/31/13 1822 02/01/13 0630 02/02/13 0430  NA 137  --  139 140  K 4.1  --  4.2 4.0  CL 103  --  109 110  CO2 20  --  20 21  GLUCOSE 98  --  92 92  BUN 29*  --  28* 24*  CREATININE 2.07* 2.01* 2.05* 2.01*  CALCIUM 9.7  --  9.4 9.6   LFT  Recent Labs  01/31/13 1409  PROT 7.5  ALBUMIN 4.5  AST 16  ALT 12  ALKPHOS 64  BILITOT 0.9   PT/INR No results found for this basename: LABPROT, INR,  in the last 72 hours Hepatitis Panel No results found for this basename: HEPBSAG, HCVAB, HEPAIGM, HEPBIGM,  in the last 72 hours  Studies/Results: No results found.  Assessment/Plan:  Acute kidney injury in setting of pyelonephritis. Treated with Cipro. Patient also placed on losartan ARB. Patient may have interaction from cipro and tacrolimus raising levels or  hemodynamically mediated injury from volume depletion and ARB. He could also have pyelonephritis associated acute kidney injury. Creatinine is improving and urine output good. The tacrolimus level is pending and a renal ultrasound would not be remiss. There does not appear to be rejection.  HTN   ARB being used maintain hydration  Anemia no ESA  Immunosuppression continue current home doses of immunosuppression.   LOS: 2 Lawerence Dery W @TODAY @6 :27 PM

## 2013-02-02 NOTE — Progress Notes (Signed)
[facility] PROGRESS NOTE  Sascha Baugher ZOX:[id number] DOB: 1986-03-31 DOA: 01/31/2013 PCP: No primary provider on file.  Assessment/Plan: Sepsis secondary to Pyelonephritis with fever, tachycardia, sepsis in the setting of immunosuppression  - Started on zosyn in the ER  - Discontinue Zosyn--> start ciprofloxacin - F/u urine culture--E.Coli - F/u blood culture--NGTD  -WBC decreasing  Renal transplant with CKD stage 3 -appreciate nephrology consultation  -compliant with medications  -Patient states that baseline creatinine around 1.8  - Hydrate--continue IV fluids  - Repeat in AM  - Minimize nephrotoxins  - Continue immunosuppression  - Check tacrolimus level--pending Sinus tachycardia, likely due to fever, dehydration, sepsis  - Improving with Hydration and abx  Cough,  -mild and CXR negative for PNA. Follow  Gout, chronic, stable. 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 Trend WBC  Normocytic anemia, possibly due to renal parenchymal disease or marrow suppression from acute illness  - Trend hemoglobin  - Transfuse for hemoglobin less than 7  Diet: Healthy heart  Access: PIV  IVF: Normal saline at 100 mL per hour x12 hours  Proph: Lovenox  Code Status: Full code  Family Communication: Pt at beside  Disposition Plan: Home 02/03/13 Antibiotics:  Zosyn 01/31/2013>>> 02/02/2013 Ciprofloxacin 02/02/2013>>>       Procedures/Studies: Dg Chest 2 View  01/31/2013   *RADIOLOGY REPORT*  Clinical Data: Fever.  CHEST - 2 VIEW  Comparison: None.  Findings: No pulmonary infiltrates, edema or pleural fluid are identified.  Cardiac and mediastinal contours are within normal limits.  There is a severe scoliosis of the thoracic spine.  IMPRESSION:  No acute findings.   Original Report Authenticated By: Irish Lack, M.D.         Subjective: Patient continues to feel better. Denies any fevers, chills, chest discomfort, nausea, vomiting, diarrhea, abdominal pain, dysuria and hematuria, rashes.  Objective: Filed Vitals:   02/02/13 0545 02/02/13 0945 02/02/13 1336 02/02/13 1659  BP: 133/91 145/100 132/86 119/80  Pulse: 50 64 56 53  Temp: 98.4 F (36.9 C) 98.4 F (36.9 C) 98.3 F (36.8 C) 98 F (36.7 C)  TempSrc: Oral Oral Oral Oral  Resp: 18 18 18 18   Height:      Weight:      SpO2: 97% 100% 100% 97%    Intake/Output Summary (Last 24 hours) at 02/02/13 1845 Last data filed at 02/02/13 0700  Gross per 24 hour  Intake   1375 ml  Output      0 ml  Net   1375 ml   Weight change: -0.3 kg (-10.6 oz) Exam:   General:  Pt is alert, follows commands appropriately, not in acute distress  HEENT: No icterus, No thrush, No neck mass, [place]/AT  Cardiovascular: RRR, S1/S2, no rubs, no gallops  Respiratory: CTA bilaterally, no wheezing, no crackles, no rhonchi  Abdomen: Soft/+BS, non tender, non distended, no guarding  Extremities: No edema, No lymphangitis, No petechiae, No rashes, no synovitis  Data Reviewed: Basic Metabolic Panel:  Recent Labs Lab 01/31/13 1409 01/31/13 1822 02/01/13 0630 02/02/13 0430  NA 137  --  139 140  K 4.1  --  4.2 4.0  CL 103  --  109 110  CO2 20  --  20 21  GLUCOSE 98  --  92 92  BUN 29*  --  28* 24*  CREATININE 2.07* 2.01* 2.05* 2.01*  CALCIUM 9.7  --  9.4 9.6   Liver Function Tests:  Recent Labs Lab 01/31/13 1409  AST 16  ALT 12  ALKPHOS 64  BILITOT 0.9  PROT 7.5  ALBUMIN 4.5   No results found for this basename: LIPASE, AMYLASE,  in the last 168 hours No results found for this basename: AMMONIA,  in the last 168 hours CBC:  Recent Labs Lab 01/31/13 1409 01/31/13 1822 02/01/13 0630 02/02/13 0430  WBC 11.5* 11.7* 10.4 8.9  NEUTROABS 9.4*  --   --   --   HGB  12.8* 11.8* 11.8* 10.6*  HCT 38.1* 35.2* 35.3* 32.7*  MCV 92.0 93.1 93.9 94.5  PLT 239 211 212 216   Cardiac Enzymes: No results found for this basename: CKTOTAL, CKMB, CKMBINDEX, TROPONINI,  in the last 168 hours BNP: No components found with this basename: POCBNP,  CBG: No results found for this basename: GLUCAP,  in the last 168 hours  Recent Results (from the past 240 hour(s))  CULTURE, BLOOD (ROUTINE X 2)     Status: None   Collection Time    01/31/13  2:14 PM      Result Value Range Status   Specimen Description LEFT ANTECUBITAL   Final   Special Requests BOTTLES DRAWN AEROBIC ONLY 10CC   Final   Culture  Setup Time 01/31/2013 21:42   Final   Culture     Final   Value:        BLOOD CULTURE RECEIVED NO GROWTH TO DATE CULTURE WILL BE HELD FOR 5 DAYS BEFORE ISSUING A FINAL NEGATIVE REPORT   Report Status PENDING   Incomplete  CULTURE, BLOOD (ROUTINE X 2)     Status: None   Collection Time    01/31/13  2:26 PM      Result Value Range Status   Specimen Description RIGHT ANTECUBITAL   Final   Special Requests BOTTLES DRAWN AEROBIC ONLY 10CC   Final   Culture  Setup Time 01/31/2013 21:42   Final   Culture     Final   Value:        BLOOD CULTURE RECEIVED NO GROWTH TO DATE CULTURE WILL BE HELD FOR 5 DAYS BEFORE ISSUING A FINAL NEGATIVE REPORT   Report Status PENDING   Incomplete  URINE CULTURE     Status: None   Collection Time    01/31/13  2:36 PM      Result Value Range Status   Specimen Description URINE, CLEAN CATCH   Final   Special Requests NONE   Final   Culture  Setup Time 01/31/2013 16:17   Final   Colony Count >=100,000 COLONIES/ML   Final   Culture ESCHERICHIA COLI   Final   Report Status 02/02/2013 FINAL   Final   Organism ID, Bacteria ESCHERICHIA COLI   Final     Scheduled Meds: . allopurinol  100 mg Oral Daily  . ciprofloxacin  500 mg Oral BID  . enoxaparin (LOVENOX) injection  40 mg Subcutaneous Q24H  . losartan  25 mg Oral Daily  . metoprolol tartrate   12.5 mg Oral BID  . mycophenolate  1,500 mg Oral BID  . predniSONE  10 mg Oral Daily  . sodium bicarbonate  1,300 mg Oral Daily  . sodium chloride  3 mL Intravenous Q12H  . tacrolimus  3 mg Oral Daily  . tacrolimus  4 mg Oral QHS   Continuous Infusions: . sodium chloride 100 mL/hr  at 02/02/13 0947     Reilynn Lauro, DO  [facility] Pager [phone]  If 7PM-7AM, please contact night-coverage [email or web address] Password [facility] 02/02/2013, 6:45 PM   LOS: 2 days

## 2013-02-03 ENCOUNTER — Inpatient Hospital Stay (HOSPITAL_COMMUNITY): Payer: BC Managed Care – PPO

## 2013-02-03 LAB — CBC
MCHC: 33.2 g/dL (ref 30.0–36.0)
Platelets: 217 10*3/uL (ref 150–400)
RDW: 13.3 % (ref 11.5–15.5)
WBC: 6 10*3/uL (ref 4.0–10.5)

## 2013-02-03 LAB — BASIC METABOLIC PANEL
Calcium: 9.7 mg/dL (ref 8.4–10.5)
Chloride: 108 mEq/L (ref 96–112)
Creatinine, Ser: 1.94 mg/dL — ABNORMAL HIGH (ref 0.50–1.35)
GFR calc Af Amer: 53 mL/min — ABNORMAL LOW (ref >90)
GFR calc non Af Amer: 45 mL/min — ABNORMAL LOW (ref >90)

## 2013-02-03 MED ORDER — CIPROFLOXACIN HCL 500 MG PO TABS: 500.0000 mg | ORAL_TABLET | Freq: Two times a day (BID) | ORAL | Status: DC

## 2013-02-03 NOTE — Discharge Summary (Addendum)
Physician Discharge Summary  Robert Bartlett RUE:[id number] DOB: [birth year] DOA: 01/31/2013  PCP: No primary provider on file.  Admit date: 01/31/2013 Discharge date: 02/03/2013  Recommendations for Outpatient Follow-up:  1. Pt will need to follow up with PCP in 2 weeks post discharge 2. Please obtain BMP to evaluate electrolytes and kidney function 3. Please also check CBC to evaluate Hg and Hct levels 4. Follow up with Nephrology, Dr. 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   Hypertension   H/O kidney transplant   Immunosuppression   Fever   Sinus tachycardia   Leukocytosis   Sepsis Sepsis secondary to Pyelonephritis with fever, tachycardia, sepsis in the setting of immunosuppression  - Started on zosyn in the ER  - Discontinue Zosyn--> start ciprofloxacin 02/02/2013 -Patient will be discharged home with ciprofloxacin 500 mg by mouth twice a day for 10 additional days to complete 14 days of therapy - F/u urine culture--E.Coli  - F/u blood culture--NGTD  -WBC 6.0 on the day of discharge  Renal transplant with CKD stage 3  -Renal function remained stable throughout the hospitalization  -Serum creatinine 2.07 on the day of admission, 1.94 on the day of discharge  -appreciate nephrology consultation--patient will followup with nephrology in the outpatient setting   -compliant with medications--he will continue on his CellCept, tacrolimus, and prednisone  -Patient states that baseline creatinine around 1.8  - Hydrate--continue IV fluids  - Minimize nephrotoxins  - Continue immunosuppression  - Check tacrolimus level--pending  -Renal ultrasound showed normal appearance of the transplant kidney with mild fullness of the collecting system without frank hydronephrosis Sinus tachycardia, likely due to fever, dehydration, sepsis  - Improving with  Hydration and abx  Cough,  -mild and CXR negative for PNA. Follow  -Improved through the hospitalization without any respiratory compromise. Gout, chronic, stable. 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, stable, continue daily disimpaction and tapwater enema, patient may perform  -Patient performed daily Water enemas without any problems of constipation or obstipation  Hypertension, blood pressure stable, continue beta blocker  H/O kidney transplant   Normocytic anemia,  -possibly due to renal parenchymal disease or marrow suppression from acute illness  - Transfuse for hemoglobin less than 7  -Remain stable although there was a mild dilutional effect due to intravenous fluids -No signs of active bleeding Diet: Healthy heart  Access: PIV  IVF: Normal saline at 100 mL per hour x12 hours  Proph: Lovenox  Code Status: Full code  Family Communication: Pt at beside  Disposition Plan: Home 02/03/13  Antibiotics:  Zosyn 01/31/2013>>> 02/02/2013  Ciprofloxacin 02/02/2013>>>   Discharge Condition: stable  Disposition:      Follow-up Information   Follow up with Garnetta Buddy, MD In 1 month.   Contact information:   [address] [place] [address] [phone]     home  Diet:renal Wt Readings from Last 3 Encounters:  02/02/13 85.701 kg (188 lb 15 oz)    History of present illness:  27 y.o. 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 His baseline creatinine is around 1.8 and he has had  recurrent UTIs for the last 5 years. He states his urinary tract infections usually respond to fluoroquinolone. His medical history also includes hypertension, hyperparathyroidism status post removal of one parathyroid gland, rectal dysmotility requiring daily enema a rectal disimpaction.  The patient states that he was at his baseline health 3 days ago. He developed some low back pain which got progressively worse. He developed high fevers to 101F this morning. He also had diffuse myalgias. He denies nausea, vomiting, diarrhea, or abdominal pain. He he firmly denies that he has had pain over his transplant. He has had dysuria without urgency or frequency. He has had some cough for the last 2 days without production of sputum or sinus symptoms. He denies sore throat or ear pain. He states his symptoms are similar to when he had urinary tract infections in the past.  In the emergency department, he was tachycardic to the 140s and his heart rate trended down with IV fluids. His temperature was 101 Fahrenheit in trended down with Tylenol. His labs demonstrates BUN of 29, creatinine 2.07, potassium 4.1, bicarbonate 20, white blood cell count 11.5, hemoglobin 12.8. Urinalysis positive for greater than 300 protein, positive nitrite, large leukocyte esterase, too numerous to count white blood cells, rare squamous epithelials, and many bacteria.    Consultants: nephrology  Discharge Exam: Filed Vitals:   02/03/13 0500  BP: 129/82  Pulse: 46  Temp: 97.4 F (36.3 C)  Resp: 20   Filed Vitals:   02/02/13 1336 02/02/13 1659 02/02/13 2103 02/03/13 0500  BP: 132/86 119/80 136/91 129/82  Pulse: 56 53 50 46  Temp: 98.3 F (36.8 C) 98 F (36.7 C) 98.3 F (36.8 C) 97.4 F (36.3 C)  TempSrc: Oral Oral Oral Oral  Resp: 18 18 18 20   Height:   5\' 11"  (1.803 m)   Weight:   85.701 kg (188 lb 15 oz)   SpO2: 100% 97% 100% 99%   General: A&O x 3, NAD, pleasant, cooperative Cardiovascular: RRR, no rub, no  gallop, no S3 Respiratory: CTAB, no wheeze, no rhonchi Abdomen:soft, nontender, nondistended, positive bowel sounds Extremities: trace LE edema, No lymphangitis, no petechiae  Discharge Instructions     Medication List         allopurinol 100 MG tablet  Commonly known as:  ZYLOPRIM  Take 100 mg by mouth daily.     ciprofloxacin 500 MG tablet  Commonly known as:  CIPRO  Take 1 tablet (500 mg total) by mouth 2 (two) times daily.     Enema Bottle Misc  by Does not apply route. Tap water enema once daily with disimpaction daily.     losartan 25 MG tablet  Commonly known as:  COZAAR  Take 25 mg by mouth daily.     metoprolol tartrate 25 MG tablet  Commonly known as:  LOPRESSOR  Take 12.5 mg by mouth 2 (two) times daily.     mycophenolate 500 MG tablet  Commonly known as:  CELLCEPT  Take 1,500 mg by mouth 2 (two) times daily.     predniSONE 10 MG tablet  Commonly known as:  DELTASONE  Take 10 mg by mouth daily.     sodium bicarbonate 650 MG tablet  Take 1,300 mg by mouth daily.     sulfamethoxazole-trimethoprim 800-160 MG per tablet  Commonly  known as:  BACTRIM DS,SEPTRA DS  Take 1 tablet by mouth once. Monday,wed,friday     tacrolimus 1 MG capsule  Commonly known as:  PROGRAF  Take 3-4 mg by mouth 2 (two) times daily. Take 3mg  in the morning and 4mg  in the evening         The results of significant diagnostics from this hospitalization (including imaging, microbiology, ancillary and laboratory) are listed below for reference.    Significant Diagnostic Studies: Dg Chest 2 View  01/31/2013   *RADIOLOGY REPORT*  Clinical Data: Fever.  CHEST - 2 VIEW  Comparison: None.  Findings: No pulmonary infiltrates, edema or pleural fluid are identified.  Cardiac and mediastinal contours are within normal limits.  There is a severe scoliosis of the thoracic spine.  IMPRESSION: No acute findings.   Original Report Authenticated By: Irish Lack, M.D.      Microbiology: Recent Results (from the past 240 hour(s))  CULTURE, BLOOD (ROUTINE X 2)     Status: None   Collection Time    01/31/13  2:14 PM      Result Value Range Status   Specimen Description LEFT ANTECUBITAL   Final   Special Requests BOTTLES DRAWN AEROBIC ONLY 10CC   Final   Culture  Setup Time 01/31/2013 21:42   Final   Culture     Final   Value:        BLOOD CULTURE RECEIVED NO GROWTH TO DATE CULTURE WILL BE HELD FOR 5 DAYS BEFORE ISSUING A FINAL NEGATIVE REPORT   Report Status PENDING   Incomplete  CULTURE, BLOOD (ROUTINE X 2)     Status: None   Collection Time    01/31/13  2:26 PM      Result Value Range Status   Specimen Description RIGHT ANTECUBITAL   Final   Special Requests BOTTLES DRAWN AEROBIC ONLY 10CC   Final   Culture  Setup Time 01/31/2013 21:42   Final   Culture     Final   Value:        BLOOD CULTURE RECEIVED NO GROWTH TO DATE CULTURE WILL BE HELD FOR 5 DAYS BEFORE ISSUING A FINAL NEGATIVE REPORT   Report Status PENDING   Incomplete  URINE CULTURE     Status: None   Collection Time    01/31/13  2:36 PM      Result Value Range Status   Specimen Description URINE, CLEAN CATCH   Final   Special Requests NONE   Final   Culture  Setup Time 01/31/2013 16:17   Final   Colony Count >=100,000 COLONIES/ML   Final   Culture ESCHERICHIA COLI   Final   Report Status 02/02/2013 FINAL   Final   Organism ID, Bacteria ESCHERICHIA COLI   Final     Labs: Basic Metabolic Panel:  Recent Labs Lab 01/31/13 1409 01/31/13 1822 02/01/13 0630 02/02/13 0430 02/03/13 0530  NA 137  --  139 140 141  K 4.1  --  4.2 4.0 4.1  CL 103  --  109 110 108  CO2 20  --  20 21 20   GLUCOSE 98  --  92 92 93  BUN 29*  --  28* 24* 21  CREATININE 2.07* 2.01* 2.05* 2.01* 1.94*  CALCIUM 9.7  --  9.4 9.6 9.7   Liver Function Tests:  Recent Labs Lab 01/31/13 1409  AST 16  ALT 12  ALKPHOS 64  BILITOT 0.9  PROT 7.5  ALBUMIN 4.5   No results found  for this basename: LIPASE,  AMYLASE,  in the last 168 hours No results found for this basename: AMMONIA,  in the last 168 hours CBC:  Recent Labs Lab 01/31/13 1409 01/31/13 1822 02/01/13 0630 02/02/13 0430 02/03/13 0530  WBC 11.5* 11.7* 10.4 8.9 6.0  NEUTROABS 9.4*  --   --   --   --   HGB 12.8* 11.8* 11.8* 10.6* 10.6*  HCT 38.1* 35.2* 35.3* 32.7* 31.9*  MCV 92.0 93.1 93.9 94.5 93.8  PLT 239 211 212 216 217   Cardiac Enzymes: No results found for this basename: CKTOTAL, CKMB, CKMBINDEX, TROPONINI,  in the last 168 hours BNP: No components found with this basename: POCBNP,  CBG: No results found for this basename: GLUCAP,  in the last 168 hours  Time coordinating discharge:  Greater than 30 minutes  Signed:  Jrake Rodriquez, DO [facility] Pager: [phone] 02/03/2013, 7:50 AM

## 2013-02-03 NOTE — Progress Notes (Signed)
Patient was discharged home with fiance. Patient was given discharge instructions and information on where to pick up prescriptions. Patient was given morning lab values per request. Patient was told to call nursing station when fiance arrived however when entering patients room patient was found to have left independently. Patient's contact number was tried to be reached with no response. Patients fiance, emergency contact, was tried to be reached with no response. Patient was independent and stable upon discharge.

## 2013-02-06 LAB — CULTURE, BLOOD (ROUTINE X 2): Culture: NO GROWTH

## 2013-02-27 ENCOUNTER — Encounter (HOSPITAL_COMMUNITY): Payer: Self-pay | Admitting: Emergency Medicine

## 2013-02-27 ENCOUNTER — Inpatient Hospital Stay (HOSPITAL_COMMUNITY)
Admission: EM | Admit: 2013-02-27 | Discharge: 2013-03-02 | DRG: 320 | Disposition: A | Discharge status: Home / Self Care | Payer: BC Managed Care – PPO | Attending: Internal Medicine | Admitting: Internal Medicine

## 2013-02-27 DIAGNOSIS — Z87891 Personal history of nicotine dependence: Secondary | ICD-10-CM

## 2013-02-27 DIAGNOSIS — Z940 Kidney transplant status: Secondary | ICD-10-CM

## 2013-02-27 DIAGNOSIS — I120 Hypertensive chronic kidney disease with stage 5 chronic kidney disease or end stage renal disease: Secondary | ICD-10-CM | POA: Diagnosis present

## 2013-02-27 DIAGNOSIS — D72829 Elevated white blood cell count, unspecified: Secondary | ICD-10-CM

## 2013-02-27 DIAGNOSIS — B961 Klebsiella pneumoniae [K. pneumoniae] as the cause of diseases classified elsewhere: Secondary | ICD-10-CM | POA: Diagnosis present

## 2013-02-27 DIAGNOSIS — I10 Essential (primary) hypertension: Secondary | ICD-10-CM | POA: Diagnosis present

## 2013-02-27 DIAGNOSIS — Z87440 Personal history of urinary (tract) infections: Secondary | ICD-10-CM

## 2013-02-27 DIAGNOSIS — M109 Gout, unspecified: Secondary | ICD-10-CM

## 2013-02-27 DIAGNOSIS — E0789 Other specified disorders of thyroid: Secondary | ICD-10-CM | POA: Diagnosis present

## 2013-02-27 DIAGNOSIS — N186 End stage renal disease: Secondary | ICD-10-CM | POA: Diagnosis present

## 2013-02-27 DIAGNOSIS — K5900 Constipation, unspecified: Secondary | ICD-10-CM | POA: Diagnosis present

## 2013-02-27 DIAGNOSIS — R509 Fever, unspecified: Secondary | ICD-10-CM

## 2013-02-27 DIAGNOSIS — IMO0002 Reserved for concepts with insufficient information to code with codable children: Secondary | ICD-10-CM

## 2013-02-27 DIAGNOSIS — N319 Neuromuscular dysfunction of bladder, unspecified: Secondary | ICD-10-CM | POA: Diagnosis present

## 2013-02-27 DIAGNOSIS — Z881 Allergy status to other antibiotic agents status: Secondary | ICD-10-CM

## 2013-02-27 DIAGNOSIS — N12 Tubulo-interstitial nephritis, not specified as acute or chronic: Secondary | ICD-10-CM

## 2013-02-27 DIAGNOSIS — D899 Disorder involving the immune mechanism, unspecified: Secondary | ICD-10-CM | POA: Diagnosis present

## 2013-02-27 DIAGNOSIS — I8390 Asymptomatic varicose veins of unspecified lower extremity: Secondary | ICD-10-CM | POA: Diagnosis present

## 2013-02-27 DIAGNOSIS — K5902 Outlet dysfunction constipation: Secondary | ICD-10-CM

## 2013-02-27 DIAGNOSIS — IMO0001 Reserved for inherently not codable concepts without codable children: Secondary | ICD-10-CM | POA: Diagnosis present

## 2013-02-27 DIAGNOSIS — Z79899 Other long term (current) drug therapy: Secondary | ICD-10-CM

## 2013-02-27 LAB — COMPREHENSIVE METABOLIC PANEL
ALT: 12 U/L (ref 0–53)
AST: 16 U/L (ref 0–37)
Alkaline Phosphatase: 64 U/L (ref 39–117)
CO2: 20 mEq/L (ref 19–32)
Calcium: 9.2 mg/dL (ref 8.4–10.5)
GFR calc Af Amer: 43 mL/min — ABNORMAL LOW (ref >90)
Glucose, Bld: 99 mg/dL (ref 70–99)
Potassium: 3.9 mEq/L (ref 3.5–5.1)
Sodium: 136 mEq/L (ref 135–145)
Total Protein: 7.3 g/dL (ref 6.0–8.3)

## 2013-02-27 LAB — CBC WITH DIFFERENTIAL/PLATELET
Basophils Absolute: 0 10*3/uL (ref 0.0–0.1)
Eosinophils Absolute: 0.3 10*3/uL (ref 0.0–0.7)
Eosinophils Relative: 3 % (ref 0–5)
Lymphocytes Relative: 10 % — ABNORMAL LOW (ref 12–46)
Lymphs Abs: 1.1 10*3/uL (ref 0.7–4.0)
Neutrophils Relative %: 74 % (ref 43–77)
Platelets: 226 10*3/uL (ref 150–400)
RBC: 3.88 MIL/uL — ABNORMAL LOW (ref 4.22–5.81)
RDW: 13.1 % (ref 11.5–15.5)
WBC: 11.3 10*3/uL — ABNORMAL HIGH (ref 4.0–10.5)

## 2013-02-27 LAB — URINALYSIS, ROUTINE W REFLEX MICROSCOPIC
Nitrite: POSITIVE — AB
Protein, ur: 100 mg/dL — AB
Specific Gravity, Urine: 1.016 (ref 1.005–1.030)
Urobilinogen, UA: 0.2 mg/dL (ref 0.0–1.0)

## 2013-02-27 LAB — URINE MICROSCOPIC-ADD ON

## 2013-02-27 MED ORDER — SODIUM BICARBONATE 650 MG PO TABS
1300.0000 mg | ORAL_TABLET | Freq: Every day | ORAL | Status: DC
  Administered 2013-02-27 – 2013-03-02 (×4): 1300 mg via ORAL
  Filled 2013-02-27 (×4): qty 2

## 2013-02-27 MED ORDER — ACETAMINOPHEN 325 MG PO TABS
650.0000 mg | ORAL_TABLET | Freq: Once | ORAL | Status: AC
  Administered 2013-02-27: 650 mg via ORAL
  Filled 2013-02-27: qty 2

## 2013-02-27 MED ORDER — SODIUM CHLORIDE 0.9 % IV SOLN
INTRAVENOUS | Status: DC
  Administered 2013-02-27 – 2013-02-28 (×4): via INTRAVENOUS
  Administered 2013-02-28: 1000 mL via INTRAVENOUS
  Administered 2013-03-01 – 2013-03-02 (×3): via INTRAVENOUS

## 2013-02-27 MED ORDER — DEXTROSE 5 % IV SOLN
1.0000 g | INTRAVENOUS | Status: DC
  Administered 2013-02-28 – 2013-03-01 (×2): 1 g via INTRAVENOUS
  Filled 2013-02-27 (×3): qty 10

## 2013-02-27 MED ORDER — CIPROFLOXACIN IN D5W 400 MG/200ML IV SOLN: 400.0000 mg | Freq: Once | INTRAVENOUS | Status: DC

## 2013-02-27 MED ORDER — TACROLIMUS 1 MG PO CAPS
3.0000 mg | ORAL_CAPSULE | Freq: Every day | ORAL | Status: DC
  Administered 2013-02-27 – 2013-03-02 (×4): 3 mg via ORAL
  Filled 2013-02-27 (×4): qty 3

## 2013-02-27 MED ORDER — MYCOPHENOLATE MOFETIL 250 MG PO CAPS
1500.0000 mg | ORAL_CAPSULE | Freq: Two times a day (BID) | ORAL | Status: DC
  Administered 2013-02-27 – 2013-03-02 (×7): 1500 mg via ORAL
  Filled 2013-02-27 (×8): qty 6

## 2013-02-27 MED ORDER — DEXTROSE 5 % IV SOLN
1.0000 g | Freq: Once | INTRAVENOUS | Status: AC
  Administered 2013-02-27: 1 g via INTRAVENOUS
  Filled 2013-02-27: qty 10

## 2013-02-27 MED ORDER — METOPROLOL TARTRATE 12.5 MG HALF TABLET
12.5000 mg | ORAL_TABLET | Freq: Two times a day (BID) | ORAL | Status: DC
  Administered 2013-02-27 – 2013-03-02 (×6): 12.5 mg via ORAL
  Filled 2013-02-27 (×8): qty 1

## 2013-02-27 MED ORDER — HYDROMORPHONE HCL PF 1 MG/ML IJ SOLN: 0.5000 mg | INTRAMUSCULAR | Status: DC | PRN

## 2013-02-27 MED ORDER — PREDNISONE 10 MG PO TABS
10.0000 mg | ORAL_TABLET | Freq: Every day | ORAL | Status: DC
  Administered 2013-02-27 – 2013-03-02 (×4): 10 mg via ORAL
  Filled 2013-02-27 (×4): qty 1

## 2013-02-27 MED ORDER — ONDANSETRON HCL 4 MG PO TABS: 4.0000 mg | ORAL_TABLET | Freq: Four times a day (QID) | ORAL | Status: DC | PRN

## 2013-02-27 MED ORDER — TACROLIMUS 1 MG PO CAPS
4.0000 mg | ORAL_CAPSULE | Freq: Every day | ORAL | Status: DC
  Administered 2013-02-27 – 2013-03-01 (×3): 4 mg via ORAL
  Filled 2013-02-27 (×4): qty 4

## 2013-02-27 MED ORDER — ALLOPURINOL 100 MG PO TABS
100.0000 mg | ORAL_TABLET | Freq: Every day | ORAL | Status: DC
  Administered 2013-02-27 – 2013-03-02 (×4): 100 mg via ORAL
  Filled 2013-02-27 (×4): qty 1

## 2013-02-27 MED ORDER — LOSARTAN POTASSIUM 25 MG PO TABS
25.0000 mg | ORAL_TABLET | Freq: Every day | ORAL | Status: DC
  Administered 2013-02-27 – 2013-03-02 (×4): 25 mg via ORAL
  Filled 2013-02-27 (×4): qty 1

## 2013-02-27 MED ORDER — ONDANSETRON HCL 4 MG/2ML IJ SOLN: 4.0000 mg | Freq: Four times a day (QID) | INTRAMUSCULAR | Status: DC | PRN

## 2013-02-27 NOTE — Progress Notes (Addendum)
[facility] PROGRESS NOTE  Robert Bartlett ZOX:[id number] DOB: 02/16/1986 DOA: 02/27/2013 PCP: No PCP Per Patient  Assessment/Plan: 1. Pyelonephritis; continue ceftriaxone until urine culture returns and make adjustments --In the a.m. Will contact Dr. Hyman Hopes (nephrologist)  --In a.m. Will consult urology in order to get patient tied i/t system (patient just moved from [place] recently) --Continue ceftriaxone, hold Bactrim  2. Immunosuppression; continue prednisone, tacrolimus, CellCept at current l doses  --will obtain tacrolimus levels, CellCept levels, uric acid levels  3. HTN; controlled on current medication    Code Status: Full   Consultants:    Procedures:    Antibiotics:  Ceftriaxone 02/27/2013  day 1, on chronic Bactrim M/W/F  HPI/Subjective: Robert Bartlett is an 27 y.o. 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 Presented to the ER with cloudy urine for 3 days, fever to 102 yesterday and myalgia with some chills. States on Friday noticed a change in order/color urine, by Sunday had positive CVA pain, positive F./C./S. Evalatuion in the ER included Cr of 2.2 ( not far from baseline), UA with TNTC WBCs, and WBC of 11K. Blood cultures and urine cultures were ordered, Rocephin IV given, and hospitalsit was asked to admit him for pyelonephritis. TODAY states negative abdominal pain, negative CVA tenderness, negative F./C./S., negative N./V.    Objective: Filed Vitals:   02/27/13 1252 02/27/13 1628 02/27/13 2053 02/28/13 0506  BP: 111/68 121/78 133/90 111/68  Pulse: 50 51 61 56  Temp: 97.7 F (36.5 C) 97.6 F (36.4 C) 98.2 F (36.8 C) 97.6 F (36.4 C)  TempSrc:   Oral Oral  Resp: 18 18 18 18    Height:   5\' 11"  (1.803 m)   Weight:   83.371 kg (183 lb 12.8 oz)   SpO2: 100% 100% 100% 99%    Intake/Output Summary (Last 24 hours) at 02/28/13 8119 Last data filed at 02/27/13 2223  Gross per 24 hour  Intake 2348.75 ml  Output      0 ml  Net 2348.75 ml   Filed Weights   02/27/13 0650 02/27/13 2053  Weight: 83.371 kg (183 lb 12.8 oz) 83.371 kg (183 lb 12.8 oz)    Exam:   General: Alert,,NAD  Cardiovascular: Regular rhythm and rate, negative murmurs rubs or gallops, DP/PT pulse 2+ bilateral  Respiratory: Clear to auscultation bilateral  Abdomen: Soft nontender nondistended plus bowel sounds, 2 well-healed incision sites consistent with kidney transplants  Musculoskeletal:  Within normal limits  Data Reviewed: Basic Metabolic Panel:  Recent Labs Lab 02/27/13 0059  NA 136  K 3.9  CL 102  CO2 20  GLUCOSE 99  BUN 33*  CREATININE 2.28*  CALCIUM 9.2   Liver Function Tests:  Recent Labs Lab 02/27/13 0059  AST 16  ALT 12  ALKPHOS 64  BILITOT 0.5  PROT 7.3  ALBUMIN 3.8   No results found for this basename: LIPASE, AMYLASE,  in the last 168 hours No results found for this basename: AMMONIA,  in the last 168 hours CBC:  Recent Labs Lab 02/27/13 0059  WBC 11.3*  NEUTROABS 8.4*  HGB 12.0*  HCT 35.8*  MCV 92.3  PLT 226   Cardiac Enzymes: No results found for this basename: CKTOTAL, CKMB, CKMBINDEX, TROPONINI,  in the last 168 hours BNP (last 3  results) No results found for this basename: PROBNP,  in the last 8760 hours CBG: No results found for this basename: GLUCAP,  in the last 168 hours  Recent Results (from the past 240 hour(s))  URINE CULTURE     Status: None   Collection Time    02/27/13 12:59 AM      Result Value Range Status   Specimen Description URINE, CLEAN CATCH   Final   Special Requests NONE   Final   Culture  Setup Time     Final   Value: 02/27/2013 01:49     Performed at [facility] Count     Final    Value: >=100,000 COLONIES/ML     Performed at [facility]   Culture     Final   Value: GRAM NEGATIVE RODS     Performed at [facility]   Report Status PENDING   Incomplete     Studies: No results found.  Scheduled Meds: . allopurinol  100 mg Oral Daily  . cefTRIAXone (ROCEPHIN)  IV  1 g Intravenous Q24H  . losartan  25 mg Oral Daily  . metoprolol tartrate  12.5 mg Oral BID  . mycophenolate  1,500 mg Oral BID  . predniSONE  10 mg Oral Daily  . sodium bicarbonate  1,300 mg Oral Daily  . tacrolimus  3 mg Oral Daily  . tacrolimus  4 mg Oral QHS   Continuous Infusions: . sodium chloride 125 mL/hr at 02/28/13 6578    Active Problems:   Gout   Chronic kidney disease, stage 3   Hypertension   H/O kidney transplant   Immunosuppression   Pyelonephritis    Time spent:     Carolyne Littles, Shela Commons  [facility] Pager [phone] If 7PM-7AM, please contact night-coverage at [email or web address], password [facility] 02/28/2013, 6:38 AM  LOS: 1 day

## 2013-02-27 NOTE — ED Notes (Signed)
PT. REPORTS FEVER THIS MORNING WITH LOWER BACK PAIN AND CONCENTRATED URINE .

## 2013-02-27 NOTE — H&P (Signed)
[facility] History and Physical  Robert Bartlett ZOX:[id number] DOB: 03/02/1986    PCP:  No PCP, Nephrologist is Dr Conseco.  Chief Complaint: cloudy urine, fever, and chills.  HPI: Robert Bartlett is an 27 y.o. 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  Evalatuion in the ER included Cr of 2.2 ( not far from baseline), UA with TNTC WBCs, and WBC of 11K.  Blood cultures and urine cultures were ordered, Rocephin IV given, and hospitalsit was asked to admit him for pyelonephritis.   Rewiew of Systems:  Constitutional: No significant weight loss or weight gain Eyes: Negative for eye pain, redness and discharge, diplopia, visual changes, or flashes of light. ENMT: Negative for ear pain, hoarseness, nasal congestion, sinus pressure and sore throat. No headaches; tinnitus, drooling, or problem swallowing. Cardiovascular: Negative for chest pain, palpitations, diaphoresis, dyspnea and peripheral edema. ; No orthopnea, PND Respiratory: Negative for cough, hemoptysis, wheezing and stridor. No pleuritic chestpain. Gastrointestinal: Negative for nausea, vomiting, diarrhea, constipation, abdominal pain, melena, blood in stool, hematemesis, jaundice and rectal bleeding.    Genitourinary: Negative for frequency, dysuria, incontinence,flank pain and hematuria; Musculoskeletal: Negative neck pain. Negative for swelling and trauma.;  Skin: . Negative for pruritus, rash, abrasions, bruising and skin lesion.; ulcerations Neuro: Negative for headache, lightheadedness and neck stiffness. Negative for weakness, altered level of consciousness , altered mental status, extremity weakness, burning feet, involuntary movement, seizure and  syncope.  Psych: negative for anxiety, depression, insomnia, tearfulness, panic attacks, hallucinations, paranoia, suicidal or homicidal ideation    Past Medical History  Diagnosis Date  . H/O kidney transplant     1st in 1995, rejected and failed.  2nd in 2003 from mother still working.  Had rejection two years ago Atgam which stopped rejection  . Hypertension   . Hyperparathyroidism     removed 1 parathyroid gland with resolution  . 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 Imperforate anus   . Congenital absence of abdominal musculature   . Congenital renal agenesis and dysgenesis   . Chronic kidney disease, stage 3   . Immunosuppression   . Pneumonia ~ 2000  . History of blood transfusion     "w/OR's" (01/31/2013)  . Varicose veins     "right ankle" (01/31/2013)    Past Surgical History  Procedure Laterality Date  . Repair imperforate anus / anorectoplasty  1980's  . Kidney transplant Right 1995  . Kidney transplant Left 2003    living donor, mother  . Pyelotomy / pyelostomy      s/p take down in 2003  . Colostomy  1980's  . Tonsillectomy  1990's  . Appendectomy  1980's  . Colostomy takedown  1980's    "only had it a couple years" (01/31/2013)  . Parathyroidectomy Right 2012    "for high calcium" (01/31/2013)    Medications:  HOME MEDS: Prior to Admission medications   Medication Sig Start Date End Date Taking? Authorizing Provider  allopurinol (ZYLOPRIM) 100 MG tablet Take 100 mg by mouth daily.   Yes Historical Provider, MD  losartan (COZAAR) 25 MG tablet Take 25 mg by mouth daily.   Yes Historical Provider, MD  metoprolol tartrate (LOPRESSOR) 25 MG  tablet Take 12.5 mg by mouth 2 (two) times daily.    Yes Historical Provider, MD  mycophenolate (CELLCEPT) 500 MG tablet Take 1,500 mg by mouth 2 (two) times daily.   Yes Historical Provider, MD  predniSONE (DELTASONE) 10 MG tablet Take 10 mg by mouth daily.   Yes  Historical Provider, MD  [employer] (ENEMA BOTTLE) MISC by Does not apply route. Tap water enema once daily with disimpaction daily.   Yes Historical Provider, MD  sodium bicarbonate 650 MG tablet Take 1,300 mg by mouth daily.   Yes Historical Provider, MD  sulfamethoxazole-trimethoprim (BACTRIM DS,SEPTRA DS) 800-160 MG per tablet Take 1 tablet by mouth once. Monday,wed,friday. On-going medication   Yes Historical Provider, MD  tacrolimus (PROGRAF) 1 MG capsule Take 3-4 mg by mouth 2 (two) times daily. Take 3mg  in the morning and 4mg  in the evening   Yes Historical Provider, MD     Allergies:  Allergies  Allergen Reactions  . Aspirin     Can not take due to kidney transplant  . Vancomycin     Vein redness and swelling on past administration  . Azithromycin Rash    Social History:   reports that he quit smoking about 10 years ago. His smoking use included Cigarettes. He has a .01 pack-year smoking history. He has never used smokeless tobacco. He reports that he drinks about 1.8 ounces of alcohol per week. He reports that he does not use illicit drugs.  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  Family History: Family History  Problem Relation Age of Onset  . High blood pressure Father   . Stroke Paternal Grandfather   . Kidney failure Neg Hx      Physical Exam: Filed Vitals:   02/27/13 0026 02/27/13 0230  BP: 125/72 130/82  Pulse: 109 110  Temp: 103.3 F (39.6 C) 98.3 F (36.8 C)  TempSrc: Oral Oral  Resp: 14 18  SpO2: 98% 100%   Blood pressure 130/82, pulse 110, temperature 98.3 F (36.8 C), temperature source Oral, resp. rate 18, SpO2 100.00%.  GEN:  Pleasant  patient lying in the stretcher in no acute distress; cooperative with exam. PSYCH:  alert and oriented x4; does not appear anxious or depressed; affect is appropriate. HEENT: Mucous membranes pink and anicteric; PERRLA; EOM intact; no cervical lymphadenopathy nor thyromegaly or carotid bruit; no JVD;  There were no stridor. Neck is very supple. Breasts:: Not examined CHEST WALL: No tenderness CHEST: Normal respiration, clear to auscultation bilaterally.  HEART: Regular rate and rhythm.  There are no murmur, rub, or gallops.   BACK: No kyphosis or scoliosis; no CVA tenderness ABDOMEN: soft and non-tender; no masses, no organomegaly, normal abdominal bowel sounds; no pannus; no intertriginous candida. There is no rebound and no distention. Rectal Exam: Not done EXTREMITIES: No bone or joint deformity; age-appropriate arthropathy of the hands and knees; no edema; no ulcerations.  There is no calf tenderness. Genitalia: not examined PULSES: 2+ and symmetric SKIN: Normal hydration no rash or ulceration CNS: Cranial nerves 2-12 grossly intact no focal lateralizing neurologic deficit.  Speech is fluent; uvula elevated with phonation, facial symmetry and tongue midline. DTR are normal bilaterally, cerebella exam is intact, barbinski is negative and strengths are equaled bilaterally.  No sensory loss.   Labs on Admission:  Basic Metabolic Panel:  Recent Labs Lab 02/27/13 0059  NA 136  K 3.9  CL 102  CO2 20  GLUCOSE 99  BUN 33*  CREATININE  2.28*  CALCIUM 9.2   Liver Function Tests:  Recent Labs Lab 02/27/13 0059  AST 16  ALT 12  ALKPHOS 64  BILITOT 0.5  PROT 7.3  ALBUMIN 3.8   No results found for this basename: LIPASE, AMYLASE,  in the last 168 hours No results found for this basename: AMMONIA,  in the last 168 hours CBC:  Recent Labs Lab 02/27/13 0059  WBC 11.3*  NEUTROABS 8.4*  HGB 12.0*  HCT 35.8*  MCV 92.3  PLT 226   Cardiac Enzymes: No results found for this basename: CKTOTAL, CKMB, CKMBINDEX, TROPONINI,  in the last 168 hours  CBG: No results found for this basename: GLUCAP,  in the last 168 hours   Radiological Exams on Admission: No results found.   Assessment/Plan Present on Admission:  . Pyelonephritis . Immunosuppression . Chronic kidney  disease, stage 3 Transplanted kidney  PLAN:  Will admit this nice gentleman for pyelonephritis in the setting of having CKD and transplanted kidney on immunosuppressants.  He will have urine and blood culture, and I will continue him on Rocephin.  He has been on prednisone, and will continue him on the same dose, as he is not having any hypotension, tachycardia or other sign and symptoms of adrenal insufficiency.  I will continue his prograft also.  He will be given some IVF and PRN analgesic.  He is stable, full code, and will be admitted to [facility] service.  Thank you for allowing me to participate in his care.  Other plans as per orders.  Code Status: FULL Unk Lightning, MD. [facility] Pager [phone] 7pm to 7am.  02/27/2013, 3:55 AM

## 2013-02-27 NOTE — ED Provider Notes (Signed)
CSN: [id number]     Arrival date & time 02/27/13  0011 History     First MD Initiated Contact with Patient 02/27/13 [phone]     Chief Complaint  Patient presents with  . Fever   (Consider location/radiation/quality/duration/timing/severity/associated sxs/prior Treatment) HPI 27 yo male presents to the ER from home with complaint of fever and concentrated urine.  Pt reports dark cloudy urine started on Friday, two days ago.  Fever started this morning, 102 at home.  Pt with h/o kidney transplant, frequent UTI/pyelo.  Admitted for same 3 weeks ago, reports finishing all of his antibiotics.  No cough, abd pain.  Some back pain earlier.  Pt followed by Dr Hyman Hopes with nephrology.  Past Medical History  Diagnosis Date  . H/O kidney transplant     1st in 1995, rejected and failed.  2nd in 2003 from mother still working.  Had rejection two years ago Atgam which stopped rejection  . Hypertension   . Hyperparathyroidism     removed 1 parathyroid gland with resolution  . 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 Imperforate anus   . Congenital absence of abdominal musculature   . Congenital renal agenesis and dysgenesis   . Chronic kidney disease, stage 3   . Immunosuppression   . Pneumonia ~ 2000  . History of blood transfusion     "w/OR's" (01/31/2013)  . Varicose veins     "right ankle" (01/31/2013)   Past Surgical History  Procedure Laterality Date  . Repair imperforate anus / anorectoplasty  1980's  . Kidney transplant Right 1995  . Kidney transplant Left 2003    living donor, mother  . Pyelotomy / pyelostomy      s/p take down in 2003  . Colostomy  1980's  . Tonsillectomy  1990's  . Appendectomy  1980's  . Colostomy takedown  1980's    "only had it a couple years" (01/31/2013)  . Parathyroidectomy Right 2012    "for high calcium" (01/31/2013)   Family History  Problem Relation Age of Onset  . High blood pressure Father   . Stroke  Paternal Grandfather   . Kidney failure Neg Hx    History  Substance Use Topics  . Smoking status: Former Smoker -- 0.10 packs/day for .1 years    Types: Cigarettes    Quit date: 02/01/2003  . Smokeless tobacco: Never Used  . Alcohol Use: 1.8 oz/week    3 Cans of beer per week     Comment: 01/31/2013 "once/wk I'll have 2-3 beers"    Review of Systems  All other systems reviewed and are negative.    Allergies  Aspirin; Vancomycin; and Azithromycin  Home Medications   Current Outpatient Rx  Name  Route  Sig  Dispense  Refill  . allopurinol (ZYLOPRIM) 100 MG tablet   Oral   Take 100 mg by mouth daily.         Marland Kitchen losartan (COZAAR) 25 MG tablet   Oral   Take 25 mg by mouth daily.         . metoprolol tartrate (LOPRESSOR) 25 MG tablet   Oral   Take 12.5 mg by mouth 2 (two) times daily.          . mycophenolate (CELLCEPT) 500 MG tablet   Oral   Take 1,500 mg by mouth 2 (two) times daily.         . predniSONE (DELTASONE) 10 MG  tablet   Oral   Take 10 mg by mouth daily.         Costella [employer] (ENEMA BOTTLE) MISC   Does not apply   by Does not apply route. Tap water enema once daily with disimpaction daily.         . sodium bicarbonate 650 MG tablet   Oral   Take 1,300 mg by mouth daily.         Marland Kitchen sulfamethoxazole-trimethoprim (BACTRIM DS,SEPTRA DS) 800-160 MG per tablet   Oral   Take 1 tablet by mouth once. Monday,wed,friday. On-going medication         . tacrolimus (PROGRAF) 1 MG capsule   Oral   Take 3-4 mg by mouth 2 (two) times daily. Take 3mg  in the morning and 4mg  in the evening          BP 130/82  Pulse 110  Temp(Src) 98.3 F (36.8 C) (Oral)  Resp 18  SpO2 100% Physical Exam  Nursing note and vitals reviewed. Constitutional: He is oriented to person, place, and time. He appears well-developed and well-nourished.  HENT:  Head: Normocephalic and atraumatic.  Nose: Nose normal.  Mouth/Throat: Oropharynx is clear and moist.  Eyes:  Conjunctivae and EOM are normal. Pupils are equal, round, and reactive to light.  Neck: Normal range of motion. Neck supple. No JVD present. No tracheal deviation present. No thyromegaly present.  Cardiovascular: Regular rhythm, normal heart sounds and intact distal pulses.  Exam reveals no gallop and no friction rub.   No murmur heard. Tachycardia noted  Pulmonary/Chest: Effort normal and breath sounds normal. No stridor. No respiratory distress. He has no wheezes. He has no rales. He exhibits no tenderness.  Abdominal: Soft. Bowel sounds are normal. He exhibits no distension and no mass. There is no tenderness. There is no rebound and no guarding.  Musculoskeletal: Normal range of motion. He exhibits no edema and no tenderness.  Lymphadenopathy:    He has no cervical adenopathy.  Neurological: He is alert and oriented to person, place, and time. He exhibits normal muscle tone. Coordination normal.  Skin: Skin is warm and dry. No rash noted. No erythema. No pallor.  Psychiatric: He has a normal mood and affect. His behavior is normal. Judgment and thought content normal.    ED Course   Procedures (including critical care time)  Labs Reviewed  URINALYSIS, ROUTINE W REFLEX MICROSCOPIC - Abnormal; Notable for the following:    APPearance TURBID (*)    Hgb urine dipstick MODERATE (*)    Protein, ur 100 (*)    Nitrite POSITIVE (*)    Leukocytes, UA LARGE (*)    All other components within normal limits  CBC WITH DIFFERENTIAL - Abnormal; Notable for the following:    WBC 11.3 (*)    RBC 3.88 (*)    Hemoglobin 12.0 (*)    HCT 35.8 (*)    Neutro Abs 8.4 (*)    Lymphocytes Relative 10 (*)    Monocytes Relative 13 (*)    Monocytes Absolute 1.5 (*)    All other components within normal limits  COMPREHENSIVE METABOLIC PANEL - Abnormal; Notable for the following:    BUN 33 (*)    Creatinine, Ser 2.28 (*)    GFR calc non Af Amer 37 (*)    GFR calc Af Amer 43 (*)    All other components  within normal limits  URINE MICROSCOPIC-ADD ON - Abnormal; Notable for the following:    Bacteria, UA  FEW (*)    All other components within normal limits  URINE CULTURE  CULTURE, BLOOD (ROUTINE X 2)  CULTURE, BLOOD (ROUTINE X 2)   No results found. 1. Pyelonephritis   2. Immunosuppression     MDM  27 yo male with fever, UTI, concerning for pyelo in transplant patient, immunocompromised.  Will d/w hospitalist for admission.  Olivia Mackie, MD 02/27/13 [phone]

## 2013-02-28 ENCOUNTER — Inpatient Hospital Stay (HOSPITAL_COMMUNITY): Payer: BC Managed Care – PPO

## 2013-02-28 LAB — COMPREHENSIVE METABOLIC PANEL
ALT: 9 U/L (ref 0–53)
AST: 10 U/L (ref 0–37)
Albumin: 3.3 g/dL — ABNORMAL LOW (ref 3.5–5.2)
Alkaline Phosphatase: 47 U/L (ref 39–117)
Potassium: 3.8 mEq/L (ref 3.5–5.1)
Sodium: 142 mEq/L (ref 135–145)
Total Protein: 6.1 g/dL (ref 6.0–8.3)

## 2013-02-28 LAB — CBC WITH DIFFERENTIAL/PLATELET
Basophils Absolute: 0 10*3/uL (ref 0.0–0.1)
Basophils Relative: 0 % (ref 0–1)
Eosinophils Absolute: 0.3 10*3/uL (ref 0.0–0.7)
Lymphs Abs: 1.5 10*3/uL (ref 0.7–4.0)
MCH: 31.9 pg (ref 26.0–34.0)
MCHC: 34.6 g/dL (ref 30.0–36.0)
Neutrophils Relative %: 62 % (ref 43–77)
Platelets: 204 10*3/uL (ref 150–400)
RBC: 3.51 MIL/uL — ABNORMAL LOW (ref 4.22–5.81)

## 2013-02-28 NOTE — Consult Note (Signed)
Reason for Consult: Recurrent UTI, Renal Transplant, Neurogenic Bladder, Retrograde Ejaculation  Referring Physician: Houston Siren MD  Robert Bartlett is an 27 y.o. male.   HPI:  1 - ESRD / Renal Transplants - Pt with h/o rt renal atrohpy (in situ), left polycystic kidney (removed) leading to ESRD s/p Rt groin Xplant 1995 at [facility], and then left groin living related donor transplant (mother) 2003 at [facility] with baseline Cr 1.8-2 rangs last 5 years. Now established at [facility] transplant program that currently managing immunosupression.  2 - Recurrent UTI - pt with problems from recurrent UTI x many years, on bactrim proph at baseline. CT Stone 02/2013 w/o stones or hydro in any kidneys, but thickened bladder c/w neurogenic.   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 Manages with baseline volitional voiding x unknown percent. Has been told to self-cath in past but not currently compliant.  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 Suspect retrograde ejacuation as notes more cloudy, thick urine post ejaculation. CT stone 02/2013 with likely atrophic prostate and SV's present. No recent cysto. No prior eval. He is recently engaged and he and his fiancee are interested in future fertility.  PMH also sig for imperforate anus / pull-through,  TNA, Appy, Parathyroidectomy.  Today Robert Bartlett is seen as hospital consult for above. He is currently admitted for pyelonephritis with CX pending. Most recently with e. Coli.   Past Medical History  Diagnosis Date  . H/O kidney transplant     1st in 1995, rejected and failed.  2nd in 2003 from mother still working.  Had rejection two years ago Atgam which stopped rejection  . Hypertension   . Hyperparathyroidism     removed 1 parathyroid gland with resolution  . 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 Imperforate anus   . Congenital absence of abdominal  musculature   . Immunosuppression   . History of blood transfusion     "w/OR's" (01/31/2013)  . Varicose veins     "right ankle" (01/31/2013)  . Pneumonia ~ 2000  . Congenital renal agenesis and dysgenesis   . Chronic kidney disease, stage 3     Past Surgical History  Procedure Laterality Date  . Repair imperforate anus / anorectoplasty  1980's  . Kidney transplant Right 1995  . Kidney transplant Left 2003    living donor, mother  . Pyelotomy / pyelostomy      s/p take down in 2003  . Colostomy  1980's  . Tonsillectomy  1990's  . Appendectomy  1980's  . Colostomy takedown  1980's    "only had it a couple years" (01/31/2013)  . Parathyroidectomy Right 2012    "for high calcium" (01/31/2013)    Family History  Problem Relation Age of Onset  . High blood pressure Father   . Stroke Paternal Grandfather   . Kidney failure Neg Hx     Social History:  reports that he quit smoking about 10 years ago. His smoking use included Cigarettes. He has a .01 pack-year smoking history. He has never used smokeless tobacco. He reports that he drinks about 1.8 ounces of alcohol per week. He reports that he does not use illicit drugs.  Allergies:  Allergies  Allergen Reactions  . Aspirin     Can not take due to kidney transplant  . Vancomycin     Vein redness and swelling on past administration  .  Azithromycin Rash    Medications: I have reviewed the patient's current medications.  Results for orders placed during the hospital encounter of 02/27/13 (from the past 48 hour(s))  URINALYSIS, ROUTINE W REFLEX MICROSCOPIC     Status: Abnormal   Collection Time    02/27/13 12:59 AM      Result Value Range   Color, Urine YELLOW  YELLOW   APPearance TURBID (*) CLEAR   Specific Gravity, Urine 1.016  1.005 - 1.030   pH 5.5  5.0 - 8.0   Glucose, UA NEGATIVE  NEGATIVE mg/dL   Hgb urine dipstick MODERATE (*) NEGATIVE   Bilirubin Urine NEGATIVE  NEGATIVE   Ketones, ur NEGATIVE  NEGATIVE mg/dL    Protein, ur 952 (*) NEGATIVE mg/dL   Urobilinogen, UA 0.2  0.0 - 1.0 mg/dL   Nitrite POSITIVE (*) NEGATIVE   Leukocytes, UA LARGE (*) NEGATIVE  CBC WITH DIFFERENTIAL     Status: Abnormal   Collection Time    02/27/13 12:59 AM      Result Value Range   WBC 11.3 (*) 4.0 - 10.5 K/uL   RBC 3.88 (*) 4.22 - 5.81 MIL/uL   Hemoglobin 12.0 (*) 13.0 - 17.0 g/dL   HCT 84.1 (*) 32.4 - 40.1 %   MCV 92.3  78.0 - 100.0 fL   MCH 30.9  26.0 - 34.0 pg   MCHC 33.5  30.0 - 36.0 g/dL   RDW 02.7  25.3 - 66.4 %   Platelets 226  150 - 400 K/uL   Neutrophils Relative % 74  43 - 77 %   Neutro Abs 8.4 (*) 1.7 - 7.7 K/uL   Lymphocytes Relative 10 (*) 12 - 46 %   Lymphs Abs 1.1  0.7 - 4.0 K/uL   Monocytes Relative 13 (*) 3 - 12 %   Monocytes Absolute 1.5 (*) 0.1 - 1.0 K/uL   Eosinophils Relative 3  0 - 5 %   Eosinophils Absolute 0.3  0.0 - 0.7 K/uL   Basophils Relative 0  0 - 1 %   Basophils Absolute 0.0  0.0 - 0.1 K/uL  COMPREHENSIVE METABOLIC PANEL     Status: Abnormal   Collection Time    02/27/13 12:59 AM      Result Value Range   Sodium 136  135 - 145 mEq/L   Potassium 3.9  3.5 - 5.1 mEq/L   Chloride 102  96 - 112 mEq/L   CO2 20  19 - 32 mEq/L   Glucose, Bld 99  70 - 99 mg/dL   BUN 33 (*) 6 - 23 mg/dL   Creatinine, Ser 4.03 (*) 0.50 - 1.35 mg/dL   Calcium 9.2  8.4 - 47.4 mg/dL   Total Protein 7.3  6.0 - 8.3 g/dL   Albumin 3.8  3.5 - 5.2 g/dL   AST 16  0 - 37 U/L   ALT 12  0 - 53 U/L   Alkaline Phosphatase 64  39 - 117 U/L   Total Bilirubin 0.5  0.3 - 1.2 mg/dL   GFR calc non Af Amer 37 (*) >90 mL/min   GFR calc Af Amer 43 (*) >90 mL/min   Comment: (NOTE)     The eGFR has been calculated using the CKD EPI equation.     This calculation has not been validated in all clinical situations.     eGFR's persistently <90 mL/min signify possible Chronic Kidney     Disease.  URINE MICROSCOPIC-ADD ON  Status: Abnormal   Collection Time    02/27/13 12:59 AM      Result Value Range   Squamous  Epithelial / LPF RARE  RARE   WBC, UA TOO NUMEROUS TO COUNT  <3 WBC/hpf   RBC / HPF 7-10  <3 RBC/hpf   Bacteria, UA FEW (*) RARE  URINE CULTURE     Status: None   Collection Time    02/27/13 12:59 AM      Result Value Range   Specimen Description URINE, CLEAN CATCH     Special Requests NONE     Culture  Setup Time       Value: 02/27/2013 01:49     Performed at [facility] Count       Value: >=100,000 COLONIES/ML     Performed at [facility]   Culture       Value: GRAM NEGATIVE RODS     Performed at [facility]   Report Status PENDING    CULTURE, BLOOD (ROUTINE X 2)     Status: None   Collection Time    02/27/13  2:55 AM      Result Value Range   Specimen Description BLOOD LEFT ARM     Special Requests BOTTLES DRAWN AEROBIC AND ANAEROBIC 5CC EACH     Culture  Setup Time       Value: 02/27/2013 09:44     Performed at [facility]   Culture       Value:        BLOOD CULTURE RECEIVED NO GROWTH TO DATE CULTURE WILL BE HELD FOR 5 DAYS BEFORE ISSUING A FINAL NEGATIVE REPORT     Performed at [facility]   Report Status PENDING    CULTURE, BLOOD (ROUTINE X 2)     Status: None   Collection Time    02/27/13  3:00 AM      Result Value Range   Specimen Description BLOOD LEFT ARM     Special Requests BOTTLES DRAWN AEROBIC ONLY 10CC     Culture  Setup Time       Value: 02/27/2013 09:40     Performed at [facility]   Culture       Value:        BLOOD CULTURE RECEIVED NO GROWTH TO DATE CULTURE WILL BE HELD FOR 5 DAYS BEFORE ISSUING A FINAL NEGATIVE REPORT     Performed at [facility]   Report Status PENDING    URIC ACID     Status: None   Collection Time    02/28/13  4:30 AM      Result Value Range   Uric Acid, Serum 7.3  4.0 - 7.8 mg/dL  CBC WITH DIFFERENTIAL     Status: Abnormal   Collection Time    02/28/13 11:05 AM      Result Value Range   WBC 7.1  4.0 - 10.5 K/uL   RBC 3.51 (*) 4.22 - 5.81 MIL/uL    Hemoglobin 11.2 (*) 13.0 - 17.0 g/dL   HCT 16.1 (*) 09.6 - 04.5 %   MCV 92.3  78.0 - 100.0 fL   MCH 31.9  26.0 - 34.0 pg   MCHC 34.6  30.0 - 36.0 g/dL   RDW 40.9  81.1 - 91.4 %   Platelets 204  150 - 400 K/uL   Neutrophils Relative % 62  43 - 77 %   Neutro Abs 4.4  1.7 -  7.7 K/uL   Lymphocytes Relative 21  12 - 46 %   Lymphs Abs 1.5  0.7 - 4.0 K/uL   Monocytes Relative 14 (*) 3 - 12 %   Monocytes Absolute 1.0  0.1 - 1.0 K/uL   Eosinophils Relative 4  0 - 5 %   Eosinophils Absolute 0.3  0.0 - 0.7 K/uL   Basophils Relative 0  0 - 1 %   Basophils Absolute 0.0  0.0 - 0.1 K/uL  COMPREHENSIVE METABOLIC PANEL     Status: Abnormal   Collection Time    02/28/13 11:05 AM      Result Value Range   Sodium 142  135 - 145 mEq/L   Potassium 3.8  3.5 - 5.1 mEq/L   Chloride 112  96 - 112 mEq/L   CO2 21  19 - 32 mEq/L   Glucose, Bld 93  70 - 99 mg/dL   BUN 29 (*) 6 - 23 mg/dL   Creatinine, Ser 1.61 (*) 0.50 - 1.35 mg/dL   Calcium 9.2  8.4 - 09.6 mg/dL   Total Protein 6.1  6.0 - 8.3 g/dL   Albumin 3.3 (*) 3.5 - 5.2 g/dL   AST 10  0 - 37 U/L   ALT 9  0 - 53 U/L   Alkaline Phosphatase 47  39 - 117 U/L   Total Bilirubin 0.2 (*) 0.3 - 1.2 mg/dL   GFR calc non Af Amer 47 (*) >90 mL/min   GFR calc Af Amer 55 (*) >90 mL/min   Comment: (NOTE)     The eGFR has been calculated using the CKD EPI equation.     This calculation has not been validated in all clinical situations.     eGFR's persistently <90 mL/min signify possible Chronic Kidney     Disease.  MAGNESIUM     Status: None   Collection Time    02/28/13 11:05 AM      Result Value Range   Magnesium 2.0  1.5 - 2.5 mg/dL    Ct Abdomen Pelvis Wo Contrast  02/28/2013   CLINICAL DATA:  Kidney transplant. Recurrent UTIs. Evaluate for stone or obstruction.  EXAM: CT ABDOMEN AND PELVIS WITHOUT CONTRAST  TECHNIQUE: Multidetector CT imaging of the abdomen and pelvis was performed following the standard protocol without intravenous contrast.   COMPARISON:  None.  FINDINGS: The heart is borderline in size. Lung bases are clear. No effusions.  Prior left nephrectomy. Right native kidney severely atrophic. No hydronephrosis. Liver, gallbladder, spleen, pancreas, adrenals have an unremarkable unenhanced appearance.  Atrophic right lower quadrant renal transplant. There is a left lower quadrant renal transplant. No hydronephrosis. No visible stones. Diffuse wall thickening within the urinary bladder, likely cystitis.  Bowel grossly unremarkable. No free fluid, free air, or adenopathy. Aorta is caliber.  No acute bony abnormality.  IMPRESSION: Left lower quadrant renal transplant without hydronephrosis or stones. Diffuse urinary bladder wall thickening, likely related to cystitis.   Electronically Signed   By: Charlett Nose   On: 02/28/2013 15:34    Review of Systems  Constitutional: Positive for fever and malaise/fatigue.  HENT: Negative.   Eyes: Negative.   Respiratory: Negative.   Cardiovascular: Negative.   Gastrointestinal: Negative.   Genitourinary: Positive for urgency and frequency. Negative for hematuria and flank pain.  Musculoskeletal: Negative.   Skin: Negative.   Neurological: Negative.   Endo/Heme/Allergies: Negative.   Psychiatric/Behavioral: Negative.    Blood pressure 128/87, pulse 51, temperature 98.1 F (36.7 C), temperature source  Oral, resp. rate 18, height 5\' 11"  (1.803 m), weight 83.371 kg (183 lb 12.8 oz), SpO2 99.00%. Physical Exam  Constitutional: He is oriented to person, place, and time. He appears well-developed and well-nourished.  HENT:  Head: Normocephalic and atraumatic.  Eyes: EOM are normal. Pupils are equal, round, and reactive to light.  Neck: Normal range of motion. Neck supple.  Cardiovascular: Normal rate.   Respiratory: Effort normal.  GI: Soft. Bowel sounds are normal.  Multiple old surgical scars in all quadrants w/o hernias.  Genitourinary: Penis normal.  Musculoskeletal: Normal range of  motion.  Neurological: He is alert and oriented to person, place, and time.  Skin: Skin is warm and dry.  Psychiatric: He has a normal mood and affect. His behavior is normal. Judgment and thought content normal.    Assessment/Plan:  1 - ESRD / Renal Transplants - Pt has good understanding of his grafts, immune suppression, and need for primary management by transplant center ([facility]) for transplant related issues. I informed him I do not manage immune suppression or perform surgery on allografts, but am happy to manage other urologic issues as he now lives in [place].  2 - Recurrent UTI - CT w/o stones or hydro, suspect neurogenic bladder / poor emtpying as primary modifiable factor. Discussed role of possible UDS for baseline and likely self-cath to help reduce infection risk. I also told him that self-cath will likely prolong his graft function as well.   3 - Neurogenic Bladder - needs baseline UDS as outpatient with plan based on findings.   4 - Retrograde Ejaculation - I had long discussion with Robert Bartlett about unique challenges to fertility with his anatomy. He would ideally need office cysto (visualize verumonantum / ejaculatory ducts) as well as post-ejacualtion UA to verify sperm present. If so, this would confirm retrograde ejaculation making simplest means of future fertility intrauterine insemination using concentrated post-ejaculate urine obtained semen which could be perforemd by reproductive gynecologist.   5 - I will arrange outpatient f/u with him at my office as he wants to have a local urologist as well. Call with questions.   Robert Bartlett 02/28/2013, 6:58 PM

## 2013-02-28 NOTE — Progress Notes (Signed)
[facility] PROGRESS NOTE  Robert Bartlett ZOX:[id number] DOB: 08-05-1985 DOA: 02/27/2013 PCP: No PCP Per Patient  Assessment/Plan: 1. Pyelonephritis; continue ceftriaxone until urine culture returns and make adjustments --Contacted Dr. Hyman Hopes (nephrologist) just give him a heads up that Robert Bartlett was back in     the hospital did not believe we need official consult since patient is improving --[facility] (Dr. Berneice Heinrich); stones CT requested, Dr. Berneice Heinrich to the results and    consult on patient. Note patient has already contacted [facility] renal transplant, however has    not been to appointment. Patient will need to establish care immediately upon discharge    with the [facility] renal transplant department.  --Continue ceftriaxone, hold Bactrim. Admitting WBC= 11.3, current WBC 7.1   2. Immunosuppression; continue prednisone, tacrolimus, CellCept at current l doses  --will obtain tacrolimus levels pending, CellCept levels pending, uric acid level= 7.0 mg/dL (slightly high but we'll not increase patient's allopurinol at this point)  3. HTN; controlled on current medication    Code Status: Full   Consultants:  [facility] (Dr. Berneice Heinrich)  Procedures: CT abdomen and pelvis without contrast 02/28/2013 Left lower quadrant renal transplant without hydronephrosis or  stones. Diffuse urinary bladder wall thickening, likely related to  cystitis.  Antibiotics:  Ceftriaxone 02/27/2013  day 2, on chronic Bactrim M/W/F  HPI/Subjective: Robert Bartlett is an 27 y.o. 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 Presented to the ER with cloudy urine for 3 days, fever to 102 yesterday and myalgia with some chills. States on  Friday noticed a change in order/color urine, by Sunday had positive CVA pain, positive F./C./S. Evalatuion in the ER included Cr of 2.2 ( not far from baseline), UA with TNTC WBCs, and WBC of 11K. Blood cultures and urine cultures were ordered, Rocephin IV given, and hospitalsit was asked to admit him for pyelonephritis. TODAY states negative abdominal pain, negative CVA tenderness, negative F./C./S., negative N./V.    Objective: Filed Vitals:   02/28/13 0506 02/28/13 1020 02/28/13 1401 02/28/13 1604  BP: 111/68 133/90 128/95 128/87  Pulse: 56 62 52 51  Temp: 97.6 F (36.4 C) 98.4 F (36.9 C) 98.3 F (36.8 C) 98.1 F (36.7 C)  TempSrc: Oral     Resp: 18 17 18 18   Height:      Weight:      SpO2: 99% 100% 99% 99%    Intake/Output Summary (Last 24 hours) at 02/28/13 1703 Last data filed at 02/28/13 1407  Gross per 24 hour  Intake 3688.75 ml  Output   1180 ml  Net 2508.75 ml   Filed Weights   02/27/13 0650 02/27/13 2053  Weight: 83.371 kg (183 lb 12.8 oz) 83.371 kg (183 lb 12.8 oz)    Exam:   General: Alert,,NAD  Cardiovascular: Regular rhythm and rate, negative murmurs rubs or gallops, DP/PT pulse 2+ bilateral  Respiratory: Clear to auscultation bilateral  Abdomen: Soft nontender nondistended plus bowel sounds, 2 well-healed incision sites consistent with kidney transplants  Musculoskeletal:  Within normal limits  Data Reviewed: Basic Metabolic Panel:  Recent Labs Lab 02/27/13 0059 02/28/13 1105  NA 136 142  K 3.9 3.8  CL 102 112  CO2 20 21  GLUCOSE 99 93  BUN 33* 29*  CREATININE 2.28* 1.88*  CALCIUM 9.2 9.2  MG  --  2.0   Liver Function Tests:  Recent Labs Lab 02/27/13 0059 02/28/13 1105  AST 16 10  ALT 12 9  ALKPHOS 64 47  BILITOT 0.5 0.2*  PROT 7.3 6.1  ALBUMIN 3.8 3.3*   No results found for this basename: LIPASE, AMYLASE,  in the last 168 hours No results found for this basename: AMMONIA,  in the last 168 hours CBC:  Recent Labs Lab  02/27/13 0059 02/28/13 1105  WBC 11.3* 7.1  NEUTROABS 8.4* 4.4  HGB 12.0* 11.2*  HCT 35.8* 32.4*  MCV 92.3 92.3  PLT 226 204   Cardiac Enzymes: No results found for this basename: CKTOTAL, CKMB, CKMBINDEX, TROPONINI,  in the last 168 hours BNP (last 3 results) No results found for this basename: PROBNP,  in the last 8760 hours CBG: No results found for this basename: GLUCAP,  in the last 168 hours  Recent Results (from the past 240 hour(s))  URINE CULTURE     Status: None   Collection Time    02/27/13 12:59 AM      Result Value Range Status   Specimen Description URINE, CLEAN CATCH   Final   Special Requests NONE   Final   Culture  Setup Time     Final   Value: 02/27/2013 01:49     Performed at [facility] Count     Final   Value: >=100,000 COLONIES/ML     Performed at [facility]   Culture     Final   Value: GRAM NEGATIVE RODS     Performed at [facility]   Report Status PENDING   Incomplete  CULTURE, BLOOD (ROUTINE X 2)     Status: None   Collection Time    02/27/13  2:55 AM      Result Value Range Status   Specimen Description BLOOD LEFT ARM   Final   Special Requests BOTTLES DRAWN AEROBIC AND ANAEROBIC 5CC EACH   Final   Culture  Setup Time     Final   Value: 02/27/2013 09:44     Performed at [facility]   Culture     Final   Value:        BLOOD CULTURE RECEIVED NO GROWTH TO DATE CULTURE WILL BE HELD FOR 5 DAYS BEFORE ISSUING A FINAL NEGATIVE REPORT     Performed at [facility]   Report Status PENDING   Incomplete  CULTURE, BLOOD (ROUTINE X 2)     Status: None   Collection Time    02/27/13  3:00 AM      Result Value Range Status   Specimen Description BLOOD LEFT ARM   Final   Special Requests BOTTLES DRAWN AEROBIC ONLY 10CC   Final   Culture  Setup Time     Final   Value: 02/27/2013 09:40     Performed at [facility]   Culture     Final   Value:        BLOOD CULTURE RECEIVED NO GROWTH TO DATE  CULTURE WILL BE HELD FOR 5 DAYS BEFORE ISSUING A FINAL NEGATIVE REPORT     Performed at [facility]   Report Status PENDING   Incomplete     Studies: Ct Abdomen Pelvis Wo Contrast  02/28/2013   CLINICAL DATA:  Kidney transplant. Recurrent UTIs. Evaluate for stone or obstruction.  EXAM: CT ABDOMEN AND PELVIS WITHOUT CONTRAST  TECHNIQUE:  Multidetector CT imaging of the abdomen and pelvis was performed following the standard protocol without intravenous contrast.  COMPARISON:  None.  FINDINGS: The heart is borderline in size. Lung bases are clear. No effusions.  Prior left nephrectomy. Right native kidney severely atrophic. No hydronephrosis. Liver, gallbladder, spleen, pancreas, adrenals have an unremarkable unenhanced appearance.  Atrophic right lower quadrant renal transplant. There is a left lower quadrant renal transplant. No hydronephrosis. No visible stones. Diffuse wall thickening within the urinary bladder, likely cystitis.  Bowel grossly unremarkable. No free fluid, free air, or adenopathy. Aorta is caliber.  No acute bony abnormality.  IMPRESSION: Left lower quadrant renal transplant without hydronephrosis or stones. Diffuse urinary bladder wall thickening, likely related to cystitis.   Electronically Signed   By: Charlett Nose   On: 02/28/2013 15:34    Scheduled Meds: . allopurinol  100 mg Oral Daily  . cefTRIAXone (ROCEPHIN)  IV  1 g Intravenous Q24H  . losartan  25 mg Oral Daily  . metoprolol tartrate  12.5 mg Oral BID  . mycophenolate  1,500 mg Oral BID  . predniSONE  10 mg Oral Daily  . sodium bicarbonate  1,300 mg Oral Daily  . tacrolimus  3 mg Oral Daily  . tacrolimus  4 mg Oral QHS   Continuous Infusions: . sodium chloride 1,000 mL (02/28/13 1500)    Active Problems:   Gout   Chronic kidney disease, stage 3   Hypertension   H/O kidney transplant   Immunosuppression   Pyelonephritis    Time spent:     Drema Dallas  [facility] Pager [phone] If  7PM-7AM, please contact night-coverage at [email or web address], password [facility] 02/28/2013, 5:03 PM  LOS: 1 day

## 2013-02-28 NOTE — Progress Notes (Signed)
[facility] PROGRESS NOTE  Robert Bartlett YNW:[id number] DOB: [birth year] DOA: 02/27/2013 PCP: No PCP Per Patient  Assessment/Plan: 1. Pyelonephritis; continue ceftriaxone until urine culture returns and make adjustments --In the a.m. Will contact Dr. Hyman Hopes (nephrologist)  --[facility] [phone]; ordered CT stone protocol per     Dr. Emmaline Life instructions (urologist) --Continue ceftriaxone, hold Bactrim  2. Immunosuppression; continue prednisone, tacrolimus, CellCept at current l doses  --will obtain tacrolimus levels, CellCept levels, uric acid levels  3. HTN; controlled on current medication    Code Status: Full   Consultants:    Procedures:    Antibiotics:  Ceftriaxone 02/27/2013  day 1, on chronic Bactrim M/W/F  HPI/Subjective: Robert Bartlett is an 27 y.o. 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 Presented to the ER with cloudy urine for 3 days, fever to 102 yesterday and myalgia with some chills. States on Friday noticed a change in order/color urine, by Sunday had positive CVA pain, positive F./C./S. Evalatuion in the ER included Cr of 2.2 ( not far from baseline), UA with TNTC WBCs, and WBC of 11K. Blood cultures and urine cultures were ordered, Rocephin IV given, and hospitalsit was asked to admit him for pyelonephritis. TODAY states negative abdominal pain, negative CVA tenderness, negative F./C./S., negative N./V.    Objective: Filed Vitals:   02/27/13 1252 02/27/13 1628 02/27/13 2053 02/28/13 0506  BP: 111/68 121/78 133/90 111/68  Pulse: 50 51 61 56  Temp: 97.7 F (36.5 C) 97.6 F (36.4 C) 98.2 F (36.8 C) 97.6 F (36.4 C)  TempSrc:   Oral Oral  Resp: 18 18 18 18    Height:   5\' 11"  (1.803 m)   Weight:   83.371 kg (183 lb 12.8 oz)   SpO2: 100% 100% 100% 99%    Intake/Output Summary (Last 24 hours) at 02/28/13 1015 Last data filed at 02/27/13 2223  Gross per 24 hour  Intake 2248.75 ml  Output      0 ml  Net 2248.75 ml   Filed Weights   02/27/13 0650 02/27/13 2053  Weight: 83.371 kg (183 lb 12.8 oz) 83.371 kg (183 lb 12.8 oz)    Exam:   General: Alert,,NAD  Cardiovascular: Regular rhythm and rate, negative murmurs rubs or gallops, DP/PT pulse 2+ bilateral  Respiratory: Clear to auscultation bilateral  Abdomen: Soft nontender nondistended plus bowel sounds, 2 well-healed incision sites consistent with kidney transplants  Musculoskeletal:  Within normal limits  Data Reviewed: Basic Metabolic Panel:  Recent Labs Lab 02/27/13 0059  NA 136  K 3.9  CL 102  CO2 20  GLUCOSE 99  BUN 33*  CREATININE 2.28*  CALCIUM 9.2   Liver Function Tests:  Recent Labs Lab 02/27/13 0059  AST 16  ALT 12  ALKPHOS 64  BILITOT 0.5  PROT 7.3  ALBUMIN 3.8   No results found for this basename: LIPASE, AMYLASE,  in the last 168 hours No results found for this basename: AMMONIA,  in the last 168 hours CBC:  Recent Labs Lab 02/27/13 0059  WBC 11.3*  NEUTROABS 8.4*  HGB 12.0*  HCT 35.8*  MCV 92.3  PLT 226   Cardiac Enzymes: No results found for this basename: CKTOTAL, CKMB, CKMBINDEX, TROPONINI,  in the last 168 hours BNP (last 3 results) No  results found for this basename: PROBNP,  in the last 8760 hours CBG: No results found for this basename: GLUCAP,  in the last 168 hours  Recent Results (from the past 240 hour(s))  URINE CULTURE     Status: None   Collection Time    02/27/13 12:59 AM      Result Value Range Status   Specimen Description URINE, CLEAN CATCH   Final   Special Requests NONE   Final   Culture  Setup Time     Final   Value: 02/27/2013 01:49     Performed at [facility] Count     Final    Value: >=100,000 COLONIES/ML     Performed at [facility]   Culture     Final   Value: GRAM NEGATIVE RODS     Performed at [facility]   Report Status PENDING   Incomplete  CULTURE, BLOOD (ROUTINE X 2)     Status: None   Collection Time    02/27/13  2:55 AM      Result Value Range Status   Specimen Description BLOOD LEFT ARM   Final   Special Requests BOTTLES DRAWN AEROBIC AND ANAEROBIC 5CC EACH   Final   Culture  Setup Time     Final   Value: 02/27/2013 09:44     Performed at [facility]   Culture     Final   Value:        BLOOD CULTURE RECEIVED NO GROWTH TO DATE CULTURE WILL BE HELD FOR 5 DAYS BEFORE ISSUING A FINAL NEGATIVE REPORT     Performed at [facility]   Report Status PENDING   Incomplete  CULTURE, BLOOD (ROUTINE X 2)     Status: None   Collection Time    02/27/13  3:00 AM      Result Value Range Status   Specimen Description BLOOD LEFT ARM   Final   Special Requests BOTTLES DRAWN AEROBIC ONLY 10CC   Final   Culture  Setup Time     Final   Value: 02/27/2013 09:40     Performed at [facility]   Culture     Final   Value:        BLOOD CULTURE RECEIVED NO GROWTH TO DATE CULTURE WILL BE HELD FOR 5 DAYS BEFORE ISSUING A FINAL NEGATIVE REPORT     Performed at [facility]   Report Status PENDING   Incomplete     Studies: No results found.  Scheduled Meds: . allopurinol  100 mg Oral Daily  . cefTRIAXone (ROCEPHIN)  IV  1 g Intravenous Q24H  . losartan  25 mg Oral Daily  . metoprolol tartrate  12.5 mg Oral BID  . mycophenolate  1,500 mg Oral BID  . predniSONE  10 mg Oral Daily  . sodium bicarbonate  1,300 mg Oral Daily  . tacrolimus  3 mg Oral Daily  . tacrolimus  4 mg Oral QHS   Continuous Infusions: . sodium chloride 125 mL/hr at 02/28/13 1914    Active Problems:   Gout   Chronic kidney disease, stage 3   Hypertension   H/O kidney transplant   Immunosuppression   Pyelonephritis    Time spent:      Carolyne Littles, Shela Commons  [facility] Pager [phone] If 7PM-7AM, please contact night-coverage at [email or web address], password [facility] 02/28/2013, 10:15 AM  LOS: 1 day

## 2013-03-01 DIAGNOSIS — K5902 Outlet dysfunction constipation: Secondary | ICD-10-CM

## 2013-03-01 LAB — CBC WITH DIFFERENTIAL/PLATELET
Eosinophils Relative: 3 % (ref 0–5)
HCT: 34.4 % — ABNORMAL LOW (ref 39.0–52.0)
Hemoglobin: 11.7 g/dL — ABNORMAL LOW (ref 13.0–17.0)
Lymphocytes Relative: 29 % (ref 12–46)
Lymphs Abs: 1.7 10*3/uL (ref 0.7–4.0)
MCV: 93.5 fL (ref 78.0–100.0)
Monocytes Relative: 8 % (ref 3–12)
Platelets: 231 10*3/uL (ref 150–400)
RBC: 3.68 MIL/uL — ABNORMAL LOW (ref 4.22–5.81)
WBC: 5.8 10*3/uL (ref 4.0–10.5)

## 2013-03-01 LAB — URINE CULTURE: Colony Count: >=100000

## 2013-03-01 LAB — COMPREHENSIVE METABOLIC PANEL
ALT: 11 U/L (ref 0–53)
Alkaline Phosphatase: 50 U/L (ref 39–117)
BUN: 23 mg/dL (ref 6–23)
CO2: 21 mEq/L (ref 19–32)
Calcium: 9.7 mg/dL (ref 8.4–10.5)
GFR calc Af Amer: 62 mL/min — ABNORMAL LOW (ref >90)
GFR calc non Af Amer: 53 mL/min — ABNORMAL LOW (ref >90)
Glucose, Bld: 97 mg/dL (ref 70–99)
Sodium: 143 mEq/L (ref 135–145)

## 2013-03-01 MED ORDER — CIPROFLOXACIN HCL 500 MG PO TABS
500.0000 mg | ORAL_TABLET | Freq: Two times a day (BID) | ORAL | Status: DC
  Administered 2013-03-01 – 2013-03-02 (×2): 500 mg via ORAL
  Filled 2013-03-01 (×4): qty 1

## 2013-03-01 NOTE — Progress Notes (Signed)
[facility] PROGRESS NOTE  Robert Bartlett WUJ:[id number] DOB: [birth year] DOA: 02/27/2013 PCP: No PCP Per Patient  Assessment/Plan: Pyelonephritis;  --Urology consulted. Recs noted - Patient will need to establish care immediately upon discharge  with the [facility] renal transplant department.  --Hold Bactrim, will d/c rocephin as pt's has improved clinically. WBC normalized. Afebrile - Will start cipro, treat for total of 10 days (7 days of cipro).  Immunosuppression: - Cont current meds - prednisone, tacrolimus, cellcept  HTN: - Stable and controlled  Code Status: Full Family Communication: Pt in room (indicate person spoken with, relationship, and if by phone, the number) Disposition Plan: Pending  Consultants:  Urology  Antibiotics: Ceftriaxone 02/27/2013>>>8/20 chronic Bactrim M/W/F - HELD Ciprofloxacin PO 8/20>>>  HPI/Subjective: No complaints. No acute events overnight  Objective: Filed Vitals:   02/28/13 1604 02/28/13 2030 03/01/13 0427 03/01/13 1023  BP: 128/87 114/74 126/86   Pulse: 51 62 50 52  Temp: 98.1 F (36.7 C) 98.4 F (36.9 C) 97.6 F (36.4 C)   TempSrc:  Oral Oral   Resp: 18 18 18    Height:  5\' 11"  (1.803 m)    Weight:  83.371 kg (183 lb 12.8 oz)    SpO2: 99% 100% 100%     Intake/Output Summary (Last 24 hours) at 03/01/13 1349 Last data filed at 03/01/13 0430  Gross per 24 hour  Intake   1980 ml  Output    480 ml  Net   1500 ml   Filed Weights   02/27/13 0650 02/27/13 2053 02/28/13 2030  Weight: 83.371 kg (183 lb 12.8 oz) 83.371 kg (183 lb 12.8 oz) 83.371 kg (183 lb 12.8 oz)    Exam:   General:  Awake, in  nad  Cardiovascular: regular, s1, s2  Respiratory: normal resp effort, no wheezing  Abdomen: soft, nondistended  Musculoskeletal: perfused, no clubbing   Data Reviewed: Basic Metabolic Panel:  Recent Labs Lab 02/27/13 0059 02/28/13 1105 03/01/13 0500  NA 136 142 143  K 3.9 3.8 4.0  CL 102 112 111  CO2 20 21 21    GLUCOSE 99 93 97  BUN 33* 29* 23  CREATININE 2.28* 1.88* 1.70*  CALCIUM 9.2 9.2 9.7  MG  --  2.0 1.9   Liver Function Tests:  Recent Labs Lab 02/27/13 0059 02/28/13 1105 03/01/13 0500  AST 16 10 12   ALT 12 9 11   ALKPHOS 64 47 50  BILITOT 0.5 0.2* 0.2*  PROT 7.3 6.1 6.7  ALBUMIN 3.8 3.3* 3.5   No results found for this basename: LIPASE, AMYLASE,  in the last 168 hours No results found for this basename: AMMONIA,  in the last 168 hours CBC:  Recent Labs Lab 02/27/13 0059 02/28/13 1105 03/01/13 0500  WBC 11.3* 7.1 5.8  NEUTROABS 8.4* 4.4 3.5  HGB 12.0* 11.2* 11.7*  HCT 35.8* 32.4* 34.4*  MCV 92.3 92.3 93.5  PLT 226 204 231   Cardiac Enzymes: No results found for this basename: CKTOTAL, CKMB, CKMBINDEX, TROPONINI,  in the last 168 hours BNP (last 3 results) No results found for this basename: PROBNP,  in the last 8760 hours CBG: No results found for this basename: GLUCAP,  in the last 168 hours  Recent Results (from the past 240 hour(s))  URINE CULTURE     Status: None   Collection Time    02/27/13 12:59 AM      Result Value Range Status   Specimen Description URINE, CLEAN CATCH   Final   Special Requests NONE  Final   Culture  Setup Time     Final   Value: 02/27/2013 01:49     Performed at [facility] Count     Final   Value: >=100,000 COLONIES/ML     Performed at [facility]   Culture     Final   Value: KLEBSIELLA PNEUMONIAE     Performed at [facility]   Report Status 03/01/2013 FINAL   Final   Organism ID, Bacteria KLEBSIELLA PNEUMONIAE   Final  CULTURE, BLOOD (ROUTINE X 2)     Status: None   Collection Time    02/27/13  2:55 AM      Result Value Range Status   Specimen Description BLOOD LEFT ARM   Final   Special Requests BOTTLES DRAWN AEROBIC AND ANAEROBIC 5CC EACH   Final   Culture  Setup Time     Final   Value: 02/27/2013 09:44     Performed at [facility]   Culture     Final   Value:         BLOOD CULTURE RECEIVED NO GROWTH TO DATE CULTURE WILL BE HELD FOR 5 DAYS BEFORE ISSUING A FINAL NEGATIVE REPORT     Performed at [facility]   Report Status PENDING   Incomplete  CULTURE, BLOOD (ROUTINE X 2)     Status: None   Collection Time    02/27/13  3:00 AM      Result Value Range Status   Specimen Description BLOOD LEFT ARM   Final   Special Requests BOTTLES DRAWN AEROBIC ONLY 10CC   Final   Culture  Setup Time     Final   Value: 02/27/2013 09:40     Performed at [facility]   Culture     Final   Value:        BLOOD CULTURE RECEIVED NO GROWTH TO DATE CULTURE WILL BE HELD FOR 5 DAYS BEFORE ISSUING A FINAL NEGATIVE REPORT     Performed at [facility]   Report Status PENDING   Incomplete  CULTURE, BLOOD (ROUTINE X 2)     Status: None   Collection Time    02/27/13  7:59 PM      Result Value Range Status   Specimen Description BLOOD RIGHT ARM   Final   Special Requests BOTTLES DRAWN AEROBIC ONLY 3CC   Final   Culture  Setup Time     Final   Value: 02/28/2013 03:55     Performed at [facility]   Culture     Final   Value:        BLOOD CULTURE RECEIVED NO GROWTH TO DATE CULTURE WILL BE HELD FOR 5 DAYS BEFORE ISSUING A FINAL NEGATIVE REPORT     Performed at [facility]   Report Status PENDING   Incomplete  CULTURE, BLOOD (ROUTINE X 2)     Status: None   Collection Time    02/27/13  9:46 PM      Result Value Range Status   Specimen Description BLOOD RIGHT ARM   Final   Special Requests BOTTLES DRAWN AEROBIC ONLY 2CC   Final   Culture  Setup Time     Final   Value: 02/28/2013 05:14     Performed at [facility]   Culture     Final   Value:        BLOOD CULTURE RECEIVED NO GROWTH TO DATE CULTURE  WILL BE HELD FOR 5 DAYS BEFORE ISSUING A FINAL NEGATIVE REPORT     Performed at [facility]   Report Status PENDING   Incomplete  URINE CULTURE     Status: None   Collection Time    02/27/13 10:12 PM      Result Value Range  Status   Specimen Description URINE, CLEAN CATCH   Final   Special Requests Immunocompromised   Final   Culture  Setup Time     Final   Value: 02/28/2013 05:08     Performed at [facility] Count     Final   Value: NO GROWTH     Performed at [facility]   Culture     Final   Value: NO GROWTH     Performed at [facility]   Report Status 03/01/2013 FINAL   Final     Studies: Ct Abdomen Pelvis Wo Contrast  02/28/2013   CLINICAL DATA:  Kidney transplant. Recurrent UTIs. Evaluate for stone or obstruction.  EXAM: CT ABDOMEN AND PELVIS WITHOUT CONTRAST  TECHNIQUE: Multidetector CT imaging of the abdomen and pelvis was performed following the standard protocol without intravenous contrast.  COMPARISON:  None.  FINDINGS: The heart is borderline in size. Lung bases are clear. No effusions.  Prior left nephrectomy. Right native kidney severely atrophic. No hydronephrosis. Liver, gallbladder, spleen, pancreas, adrenals have an unremarkable unenhanced appearance.  Atrophic right lower quadrant renal transplant. There is a left lower quadrant renal transplant. No hydronephrosis. No visible stones. Diffuse wall thickening within the urinary bladder, likely cystitis.  Bowel grossly unremarkable. No free fluid, free air, or adenopathy. Aorta is caliber.  No acute bony abnormality.  IMPRESSION: Left lower quadrant renal transplant without hydronephrosis or stones. Diffuse urinary bladder wall thickening, likely related to cystitis.   Electronically Signed   By: Charlett Nose   On: 02/28/2013 15:34    Scheduled Meds: . allopurinol  100 mg Oral Daily  . ciprofloxacin  500 mg Oral BID  . losartan  25 mg Oral Daily  . metoprolol tartrate  12.5 mg Oral BID  . mycophenolate  1,500 mg Oral BID  . predniSONE  10 mg Oral Daily  . sodium bicarbonate  1,300 mg Oral Daily  . tacrolimus  3 mg Oral Daily  . tacrolimus  4 mg Oral QHS   Continuous Infusions: . sodium chloride 125  mL/hr at 03/01/13 1610    Active Problems:   Gout   Chronic kidney disease, stage 3   Hypertension   H/O kidney transplant   Immunosuppression   Pyelonephritis    Time spent:    CHIU, STEPHEN K  [facility] Pager [phone]. If 7PM-7AM, please contact night-coverage at [email or web address], password [facility] 03/01/2013, 1:49 PM  LOS: 2 days

## 2013-03-02 DIAGNOSIS — Z940 Kidney transplant status: Secondary | ICD-10-CM

## 2013-03-02 LAB — CBC WITH DIFFERENTIAL/PLATELET
Basophils Relative: 0 % (ref 0–1)
Eosinophils Absolute: 0.1 10*3/uL (ref 0.0–0.7)
MCH: 31.4 pg (ref 26.0–34.0)
MCHC: 34.1 g/dL (ref 30.0–36.0)
Neutrophils Relative %: 52 % (ref 43–77)
Platelets: 230 10*3/uL (ref 150–400)
RBC: 3.28 MIL/uL — ABNORMAL LOW (ref 4.22–5.81)

## 2013-03-02 LAB — COMPREHENSIVE METABOLIC PANEL
ALT: 12 U/L (ref 0–53)
AST: 11 U/L (ref 0–37)
Albumin: 2.9 g/dL — ABNORMAL LOW (ref 3.5–5.2)
Alkaline Phosphatase: 41 U/L (ref 39–117)
Potassium: 3.8 mEq/L (ref 3.5–5.1)
Sodium: 141 mEq/L (ref 135–145)
Total Protein: 5.9 g/dL — ABNORMAL LOW (ref 6.0–8.3)

## 2013-03-02 MED ORDER — CIPROFLOXACIN HCL 500 MG PO TABS: 500.0000 mg | ORAL_TABLET | Freq: Two times a day (BID) | ORAL | Status: AC

## 2013-03-02 NOTE — Discharge Summary (Signed)
Physician Discharge Summary  Robert Bartlett BMW:[id number] DOB: 1986-06-08 DOA: 02/27/2013  PCP: No PCP Per Patient  Admit date: 02/27/2013 Discharge date: 03/02/2013  Time spent: 30 minutes  Recommendations for Outpatient Follow-up:  1. Establish with [facility] renal transplant service as soon as possible 2. Establish and follow up with PCP in 1-2 weeks  Discharge Diagnoses:  Active Problems:   Gout   Chronic kidney disease, stage 3   Hypertension   H/O kidney transplant   Immunosuppression   Pyelonephritis   Discharge Condition: Improved  Diet recommendation: Regular  Filed Weights   02/27/13 2053 02/28/13 2030 03/01/13 2053  Weight: 83.371 kg (183 lb 12.8 oz) 83.371 kg (183 lb 12.8 oz) 83.371 kg (183 lb 12.8 oz)    History of present illness:  Robert Bartlett is an 27 y.o. 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 Evalatuion in the ER included Cr of 2.2 ( not far from baseline), UA with TNTC WBCs, and WBC of 11K. Blood cultures and urine cultures were ordered, Rocephin IV given, and hospitalsit was asked to admit him for pyelonephritis.   Hospital Course:  The patient was admitted to the floor. The patient was continued on his chronic immunosuppressives. Ceftriaxone was empirically started. The patient's urine culture was later positive for multidrug sensitive Klebsiella pneumoniae. The patient was then transitioned to PO cipro and remained afebrile w/o leukocytosis. The patient was otherwise stable for close outpatient follow up.  Consultations:  Urology  Discharge Exam: Filed Vitals:   03/01/13 1415 03/01/13 1800 03/01/13 2053 03/02/13 0533  BP: 117/81 129/89 145/99 118/79  Pulse: 44 47 46 43  Temp:  98.8 F (37.1 C) 98.4 F (36.9 C) 98.4 F (36.9 C) 98.8 F (37.1 C)  TempSrc: Oral Oral Oral Oral  Resp: 20 20 20 20   Height:   5\' 11"  (1.803 m)   Weight:   83.371 kg (183 lb 12.8 oz)   SpO2: 99% 100%  100%    General: Awake, in nad Cardiovascular: regular, s1, s2 Respiratory: normal resp effort, no wheezing  Discharge Instructions     Medication List         allopurinol 100 MG tablet  Commonly known as:  ZYLOPRIM  Take 100 mg by mouth daily.     ciprofloxacin 500 MG tablet  Commonly known as:  CIPRO  Take 1 tablet (500 mg total) by mouth 2 (two) times daily.     Enema Bottle Misc  by Does not apply route. Tap water enema once daily with disimpaction daily.     losartan 25 MG tablet  Commonly known as:  COZAAR  Take 25 mg by mouth daily.     metoprolol tartrate 25 MG tablet  Commonly known as:  LOPRESSOR  Take 12.5 mg by mouth 2 (two) times daily.     mycophenolate 500 MG tablet  Commonly known as:  CELLCEPT  Take 1,500 mg by mouth 2 (two) times daily.     predniSONE 10 MG tablet  Commonly known as:  DELTASONE  Take 10 mg by mouth daily.     sodium bicarbonate 650 MG tablet  Take 1,300 mg by mouth daily.     sulfamethoxazole-trimethoprim 800-160 MG per tablet  Commonly known as:  BACTRIM DS,SEPTRA DS  Take 1 tablet by mouth once. Monday,wed,friday. On-going medication     tacrolimus 1 MG capsule  Commonly known as:  PROGRAF  Take 3-4 mg by mouth 2 (two) times daily. Take 3mg  in the morning and 4mg  in the evening       Allergies  Allergen Reactions  . Aspirin     Can not take due to kidney transplant  . Vancomycin     Vein redness and swelling on past administration  . Azithromycin Rash   Follow-up Information   Follow up with Establish and follow up with PCP in 1-2 weeks.      Follow up with Establish and follow up with Renal Transplant Service as soon as possible.       The results of significant diagnostics from this hospitalization  (including imaging, microbiology, ancillary and laboratory) are listed below for reference.    Significant Diagnostic Studies: Ct Abdomen Pelvis Wo Contrast  02/28/2013   CLINICAL DATA:  Kidney transplant. Recurrent UTIs. Evaluate for stone or obstruction.  EXAM: CT ABDOMEN AND PELVIS WITHOUT CONTRAST  TECHNIQUE: Multidetector CT imaging of the abdomen and pelvis was performed following the standard protocol without intravenous contrast.  COMPARISON:  None.  FINDINGS: The heart is borderline in size. Lung bases are clear. No effusions.  Prior left nephrectomy. Right native kidney severely atrophic. No hydronephrosis. Liver, gallbladder, spleen, pancreas, adrenals have an unremarkable unenhanced appearance.  Atrophic right lower quadrant renal transplant. There is a left lower quadrant renal transplant. No hydronephrosis. No visible stones. Diffuse wall thickening within the urinary bladder, likely cystitis.  Bowel grossly unremarkable. No free fluid, free air, or adenopathy. Aorta is caliber.  No acute bony abnormality.  IMPRESSION: Left lower quadrant renal transplant without hydronephrosis or stones. Diffuse urinary bladder wall thickening, likely related to cystitis.   Electronically Signed   By: Charlett Nose   On: 02/28/2013 15:34   Dg Chest 2 View  01/31/2013   *RADIOLOGY REPORT*  Clinical Data: Fever.  CHEST - 2 VIEW  Comparison: None.  Findings: No pulmonary infiltrates, edema or pleural fluid are identified.  Cardiac and mediastinal contours are within normal limits.  There is a severe scoliosis of the thoracic spine.  IMPRESSION: No acute findings.   Original Report Authenticated By: Irish Lack, M.D.   US Renal Transplant W/doppler  02/03/2013   *RADIOLOGY REPORT*  Clinical Data:  Evaluate renal transplant.  ULTRASOUND OF RENAL TRANSPLANT  Technique: Ultrasound examination of the renal transplant was performed, with color and duplex Doppler evaluation.  Comparison:  None.  Findings:   Transplant kidney location:  Left lower quadrant.  Transplant kidney description:  Normal in size and parenchymal echogenicity. Mild fullness of the renal collecting system, best seen in the upper pole.  No frank hydronephrosis. .  Kidney measures 12.3 cm in length.  Color flow in the main renal artery at the hilum:  Present  Color flow in the main renal vein at the hilum:  Present  Resistive indices:       Main renal artery:  0.66       Upper pole segmental renal artery: 0.7       Lower pole segmental renal artery:  0.62  Bladder:  Normal for degree of bladder distention.  Other findings:  None  IMPRESSION: Normal appearance of the transplant kidney.  Mild fullness of the renal collecting system.   Original Report Authenticated By: Richarda Overlie, M.D.    Microbiology: Recent Results (from the past 240 hour(s))  URINE CULTURE     Status: None   Collection Time    02/27/13 12:59 AM      Result Value Range Status   Specimen Description URINE, CLEAN CATCH   Final   Special Requests NONE   Final   Culture  Setup Time     Final   Value: 02/27/2013 01:49     Performed at [facility] Count     Final   Value: >=100,000 COLONIES/ML     Performed at [facility]   Culture     Final   Value: KLEBSIELLA PNEUMONIAE     Performed at [facility]   Report Status 03/01/2013 FINAL   Final   Organism ID, Bacteria KLEBSIELLA PNEUMONIAE   Final  CULTURE, BLOOD (ROUTINE X 2)     Status: None   Collection Time    02/27/13  2:55 AM      Result Value Range Status   Specimen Description BLOOD LEFT ARM   Final   Special Requests BOTTLES DRAWN AEROBIC AND ANAEROBIC 5CC EACH   Final   Culture  Setup Time     Final   Value: 02/27/2013 09:44     Performed at [facility]   Culture     Final   Value:        BLOOD CULTURE RECEIVED NO GROWTH TO DATE CULTURE WILL BE HELD FOR 5 DAYS BEFORE ISSUING A FINAL NEGATIVE REPORT     Performed at [facility]   Report Status  PENDING   Incomplete  CULTURE, BLOOD (ROUTINE X 2)     Status: None   Collection Time    02/27/13  3:00 AM      Result Value Range Status   Specimen Description BLOOD LEFT ARM   Final   Special Requests BOTTLES DRAWN AEROBIC ONLY 10CC   Final   Culture  Setup Time     Final   Value: 02/27/2013 09:40     Performed at [facility]   Culture     Final   Value:        BLOOD CULTURE RECEIVED NO GROWTH TO DATE CULTURE WILL BE HELD FOR 5 DAYS BEFORE ISSUING A FINAL NEGATIVE REPORT     Performed at [facility]   Report Status PENDING   Incomplete  CULTURE, BLOOD (ROUTINE X 2)     Status: None   Collection Time    02/27/13  7:59 PM      Result Value Range Status   Specimen Description BLOOD RIGHT ARM   Final   Special Requests BOTTLES DRAWN AEROBIC ONLY 3CC   Final   Culture  Setup Time     Final   Value: 02/28/2013 03:55     Performed at [facility]   Culture     Final   Value:        BLOOD CULTURE RECEIVED NO GROWTH TO DATE CULTURE WILL BE HELD FOR 5 DAYS BEFORE ISSUING A FINAL NEGATIVE REPORT     Performed at [facility]   Report Status PENDING   Incomplete  CULTURE, BLOOD (ROUTINE X 2)     Status: None   Collection Time    02/27/13  9:46 PM      Result Value Range Status   Specimen Description BLOOD RIGHT ARM   Final   Special Requests BOTTLES DRAWN AEROBIC ONLY 2CC   Final   Culture  Setup Time  Final   Value: 02/28/2013 05:14     Performed at [facility]   Culture     Final   Value:        BLOOD CULTURE RECEIVED NO GROWTH TO DATE CULTURE WILL BE HELD FOR 5 DAYS BEFORE ISSUING A FINAL NEGATIVE REPORT     Performed at [facility]   Report Status PENDING   Incomplete  URINE CULTURE     Status: None   Collection Time    02/27/13 10:12 PM      Result Value Range Status   Specimen Description URINE, CLEAN CATCH   Final   Special Requests Immunocompromised   Final   Culture  Setup Time     Final   Value: 02/28/2013 05:08      Performed at [facility] Count     Final   Value: NO GROWTH     Performed at [facility]   Culture     Final   Value: NO GROWTH     Performed at [facility]   Report Status 03/01/2013 FINAL   Final     Labs: Basic Metabolic Panel:  Recent Labs Lab 02/27/13 0059 02/28/13 1105 03/01/13 0500 03/02/13 0500  NA 136 142 143 141  K 3.9 3.8 4.0 3.8  CL 102 112 111 112  CO2 20 21 21 20   GLUCOSE 99 93 97 84  BUN 33* 29* 23 18  CREATININE 2.28* 1.88* 1.70* 1.51*  CALCIUM 9.2 9.2 9.7 9.0  MG  --  2.0 1.9 1.5   Liver Function Tests:  Recent Labs Lab 02/27/13 0059 02/28/13 1105 03/01/13 0500 03/02/13 0500  AST 16 10 12 11   ALT 12 9 11 12   ALKPHOS 64 47 50 41  BILITOT 0.5 0.2* 0.2* 0.2*  PROT 7.3 6.1 6.7 5.9*  ALBUMIN 3.8 3.3* 3.5 2.9*   No results found for this basename: LIPASE, AMYLASE,  in the last 168 hours No results found for this basename: AMMONIA,  in the last 168 hours CBC:  Recent Labs Lab 02/27/13 0059 02/28/13 1105 03/01/13 0500 03/02/13 0500  WBC 11.3* 7.1 5.8 5.3  NEUTROABS 8.4* 4.4 3.5 2.8  HGB 12.0* 11.2* 11.7* 10.3*  HCT 35.8* 32.4* 34.4* 30.2*  MCV 92.3 92.3 93.5 92.1  PLT 226 204 231 230   Cardiac Enzymes: No results found for this basename: CKTOTAL, CKMB, CKMBINDEX, TROPONINI,  in the last 168 hours BNP: BNP (last 3 results) No results found for this basename: PROBNP,  in the last 8760 hours CBG: No results found for this basename: GLUCAP,  in the last 168 hours     Signed:  Angele Wiemann K  [facility] 03/02/2013, 10:44 AM

## 2013-03-05 LAB — CULTURE, BLOOD (ROUTINE X 2): Culture: NO GROWTH

## 2013-03-06 LAB — CULTURE, BLOOD (ROUTINE X 2): Culture: NO GROWTH

## 2015-03-01 IMAGING — US US RENAL TRANSPLANT
1 series · 14 of 25 positions shown · non-contrast
Comparison: None.

CLINICAL DATA: Evaluate renal transplant.

ULTRASOUND OF RENAL TRANSPLANT
TECHNIQUE: Ultrasound examination of the renal transplant was
performed, with color and duplex Doppler evaluation.

[Series 1: us renal transplant · 0.26mm/px · 14 of 39 slices shown]
[im 1/39]
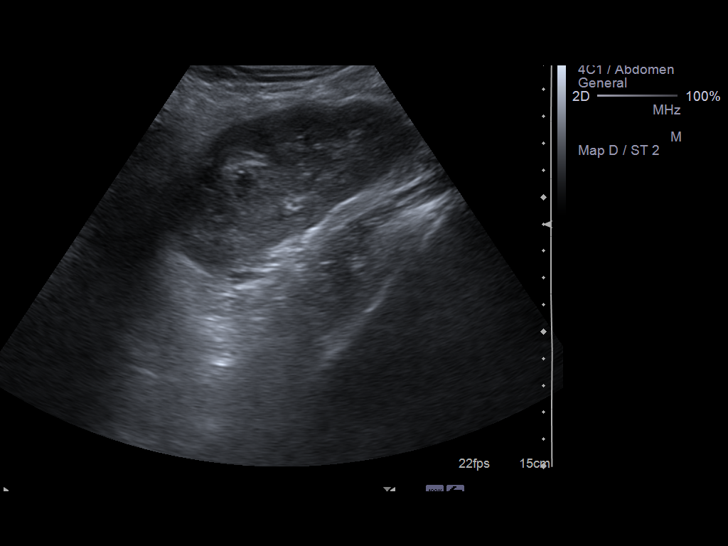
[im 4/39]
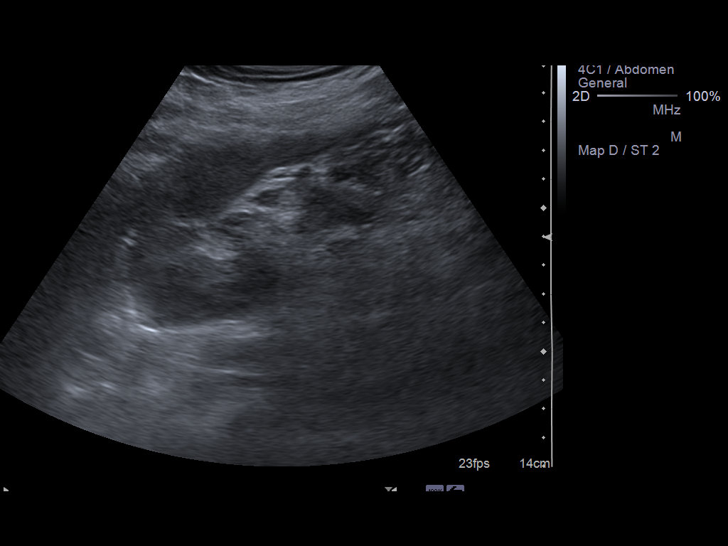
[im 7/39]
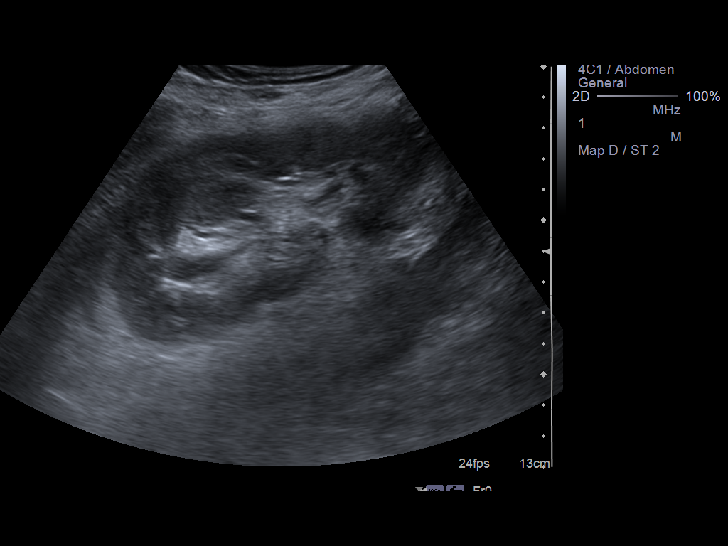
[im 10/39]
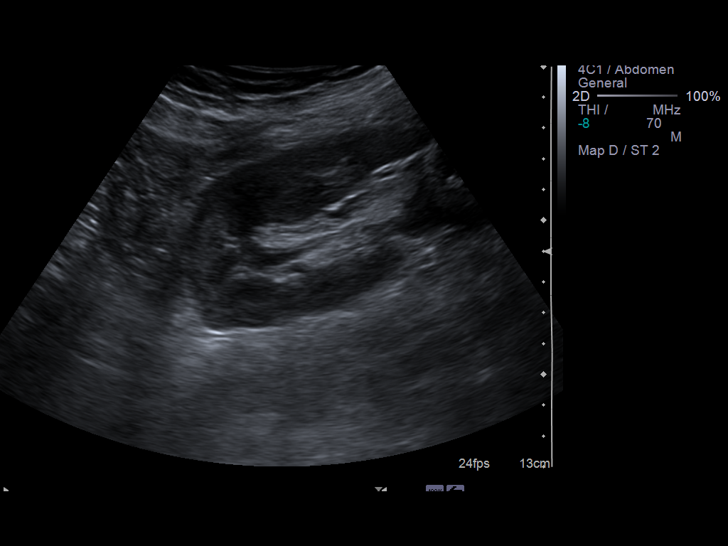
[im 13/39]
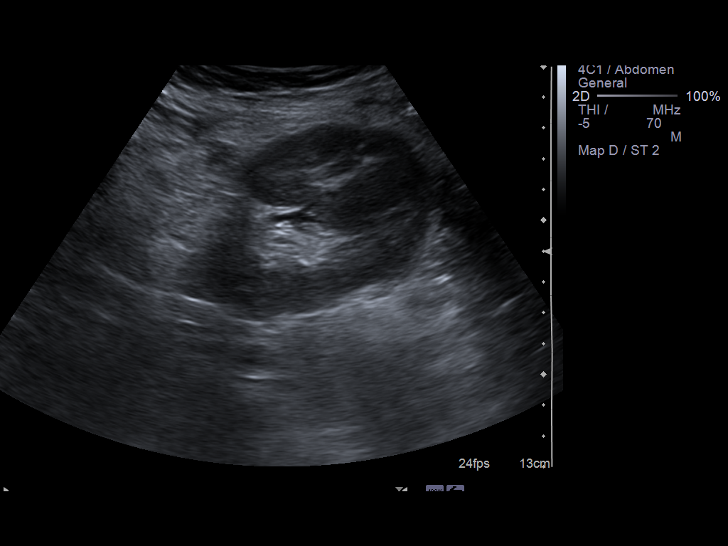
[im 15/39]
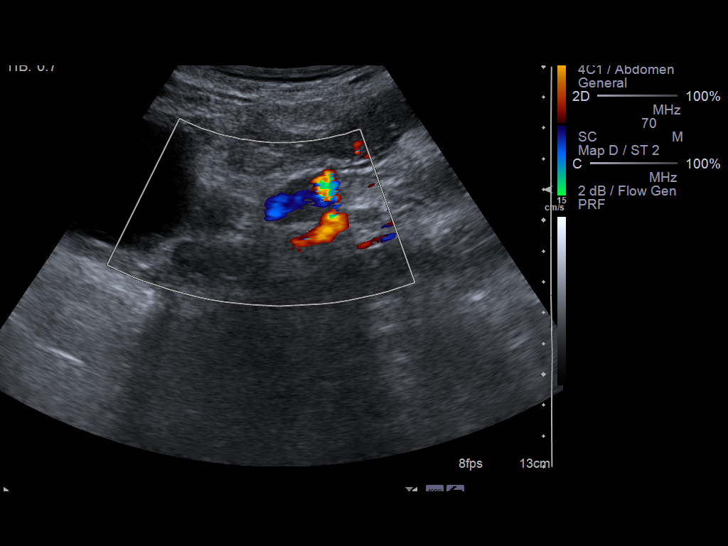
[im 18/39]
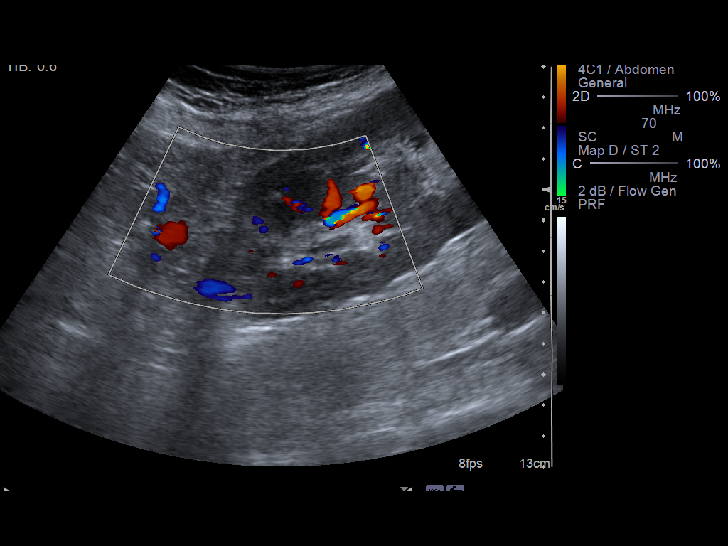
[im 21/39]
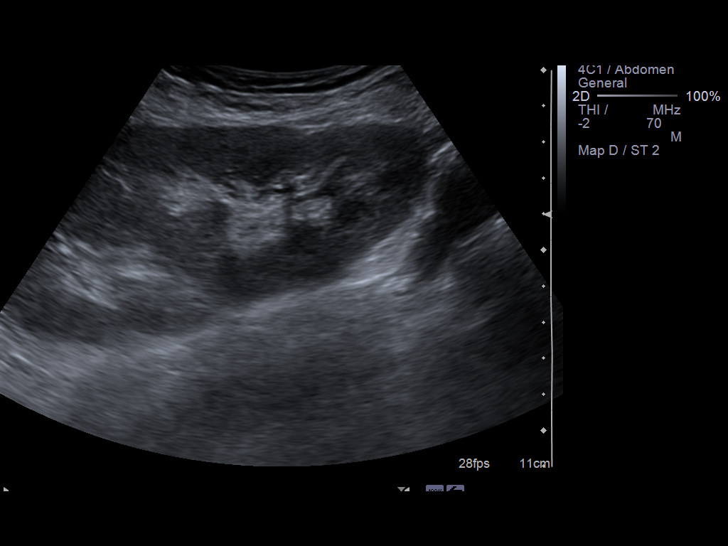
[im 24/39]
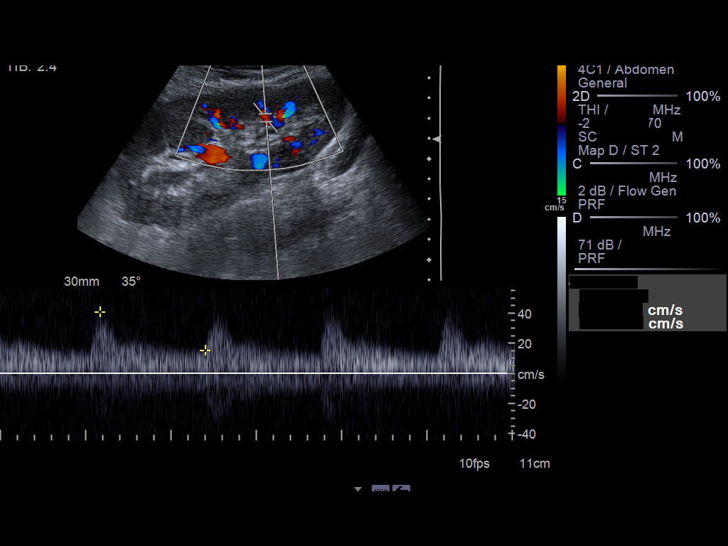
[im 26/39]
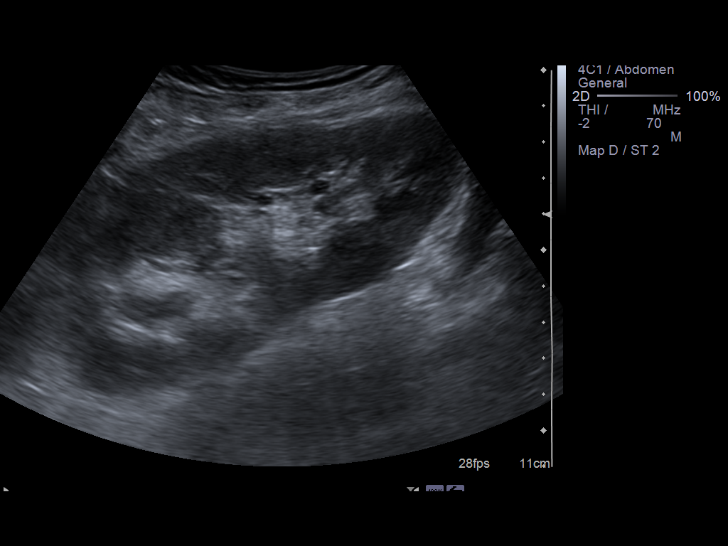
[im 29/39]
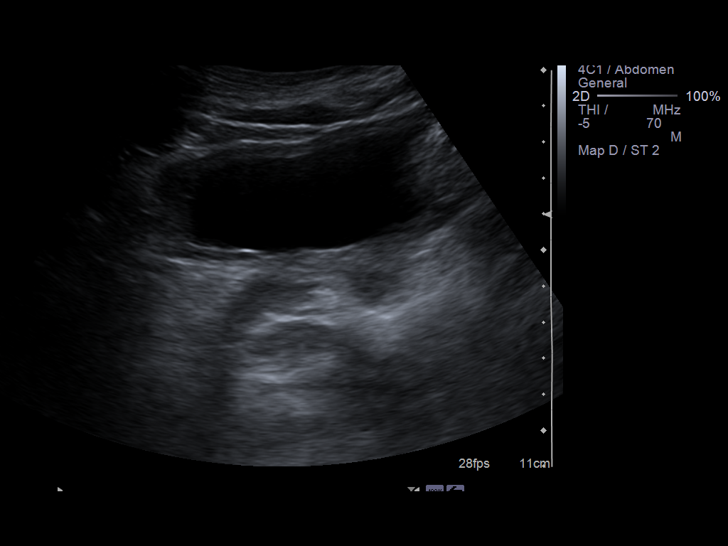
[im 32/39]
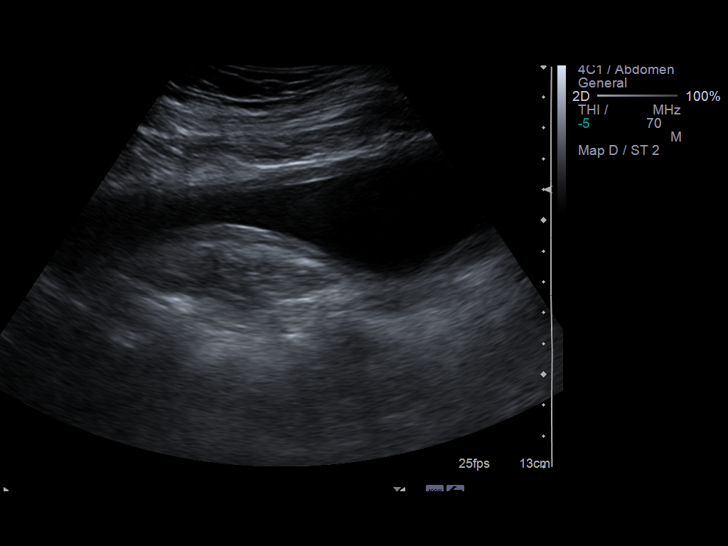
[im 35/39]
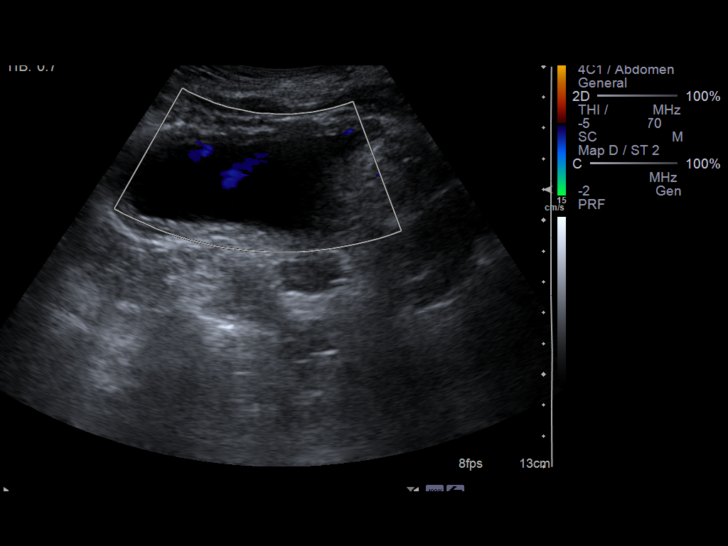
[im 39/39]
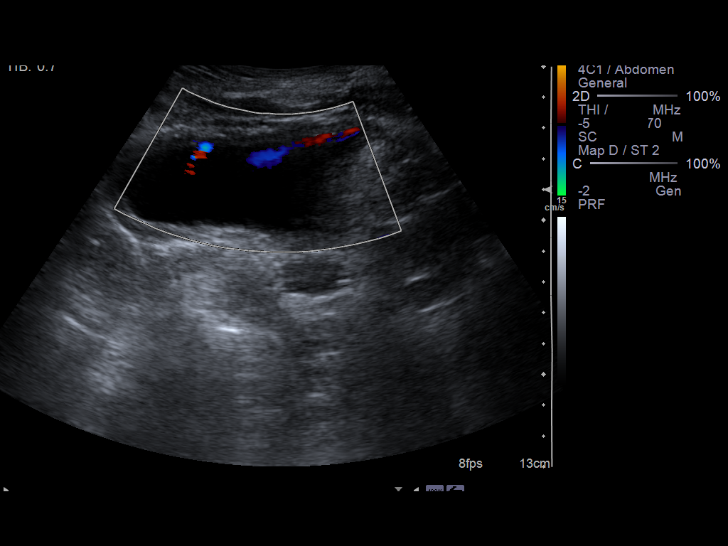

[14 of 25 positions shown; findings below may reference images not displayed]

FINDINGS: Transplant kidney location:  Left lower quadrant.

Transplant kidney description:  Normal in size and parenchymal
echogenicity. Mild fullness of the renal collecting system, best
seen in the upper pole.  No frank hydronephrosis. .  Kidney
measures 12.3 cm in length.

Color flow in the main renal artery at the hilum:  Present

Color flow in the main renal vein at the hilum:  Present

Resistive indices:
      Main renal artery:
      Upper pole segmental renal artery:
      Lower pole segmental renal artery:

Bladder:  Normal for degree of bladder distention.

Other findings:  None
IMPRESSION: Normal appearance of the transplant kidney.  Mild fullness of the
renal collecting system.

## 2015-03-26 IMAGING — CT CT ABD-PELV W/O CM
2 of 4 series · 17 of 46 positions shown, 19 images · non-contrast
Comparison: None.

CLINICAL DATA: Kidney transplant. Recurrent UTIs. Evaluate for
stone or obstruction.

EXAM:
CT ABDOMEN AND PELVIS WITHOUT CONTRAST
TECHNIQUE: Multidetector CT imaging of the abdomen and pelvis was performed
following the standard protocol without intravenous contrast.

[Series 2: stone study 5.0 i30f 1 · axial · 0.83mm/px · z∈[+762,+1192]mm · 14 of 94 slices shown, 16 images]
[im 4/94  soft-tissue]
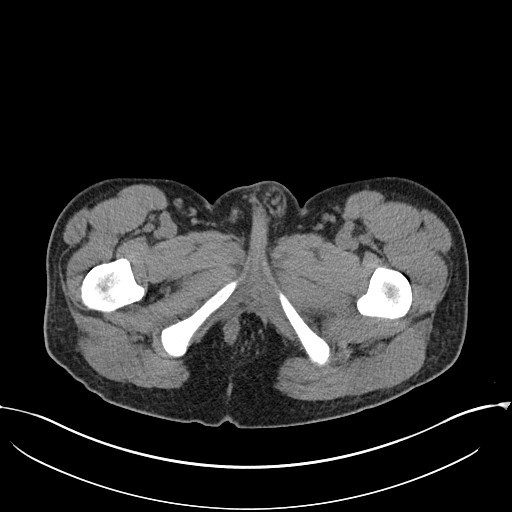
[im 4/94  bone]
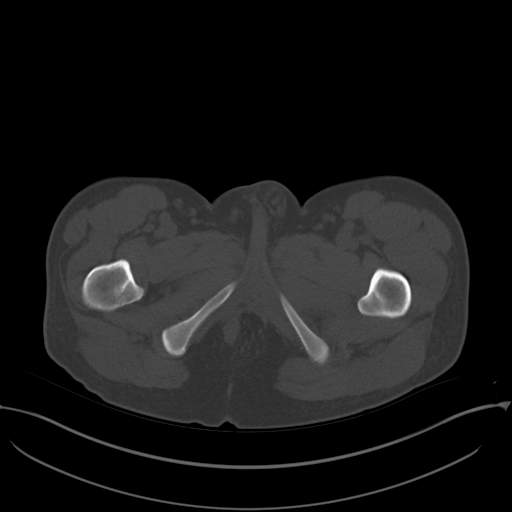
[im 12/94  soft-tissue]
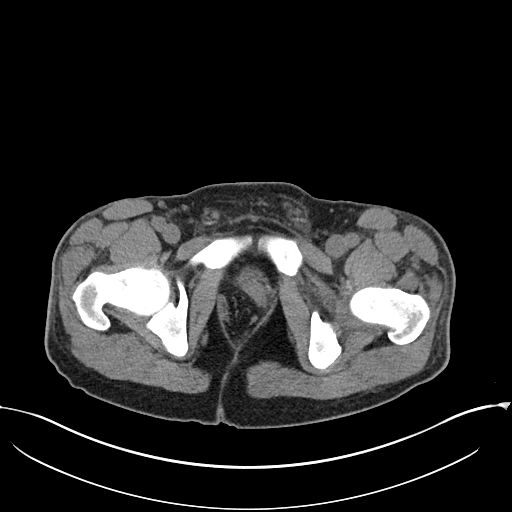
[im 19/94  soft-tissue]
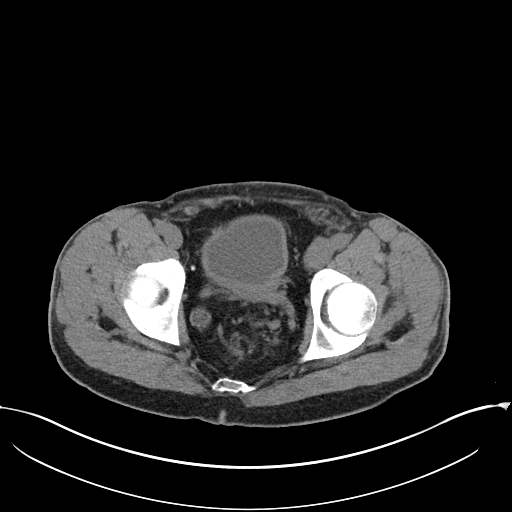
[im 27/94  soft-tissue]
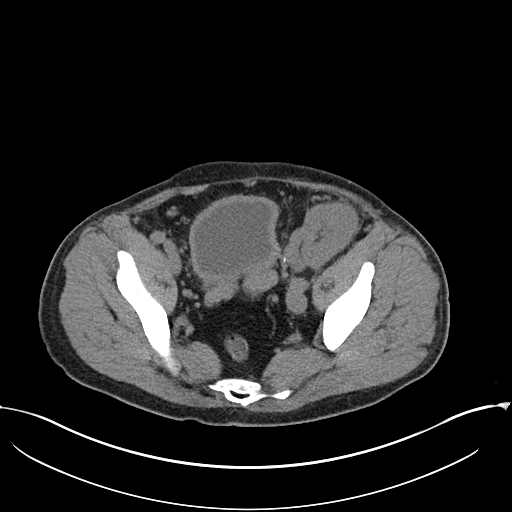
[im 30/94  soft-tissue]
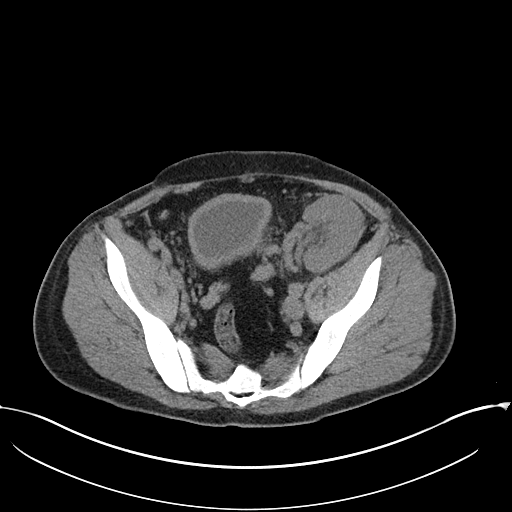
[im 38/94  soft-tissue]
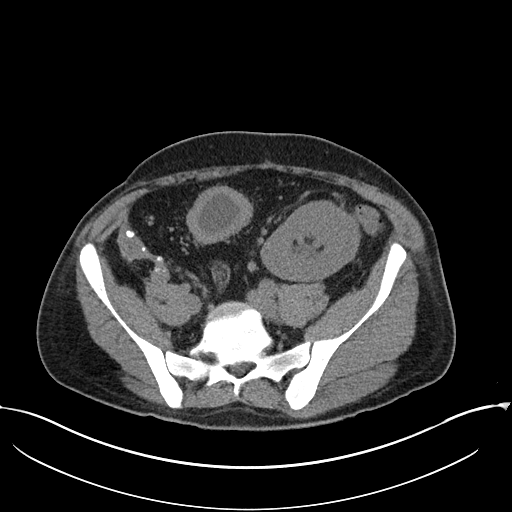
[im 45/94  soft-tissue]
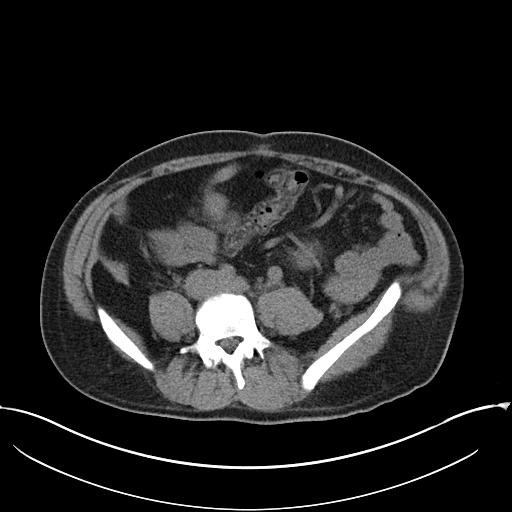
[im 49/94  soft-tissue]
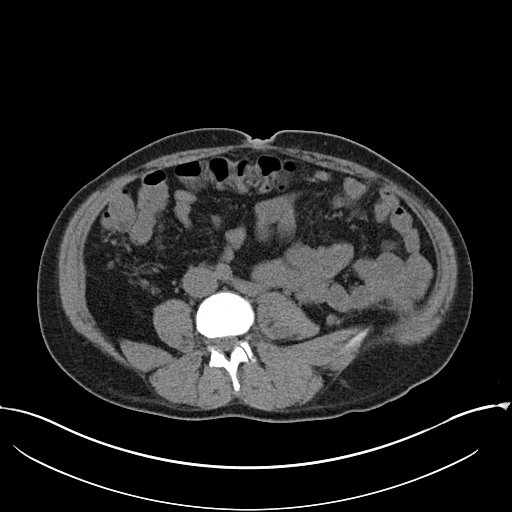
[im 56/94  soft-tissue]
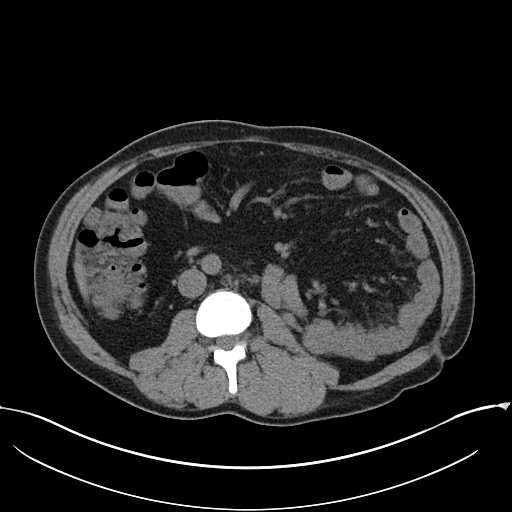
[im 56/94  bone]
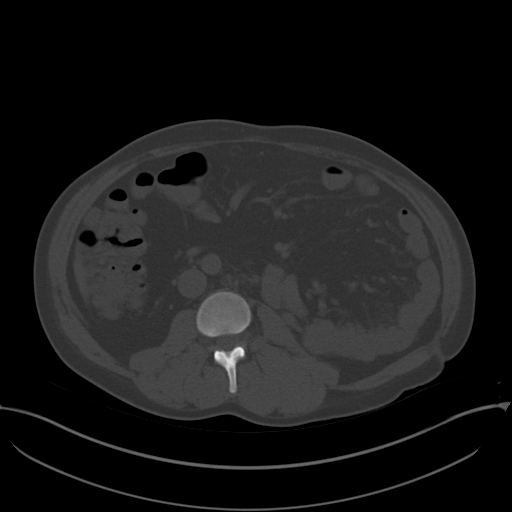
[im 64/94  soft-tissue]
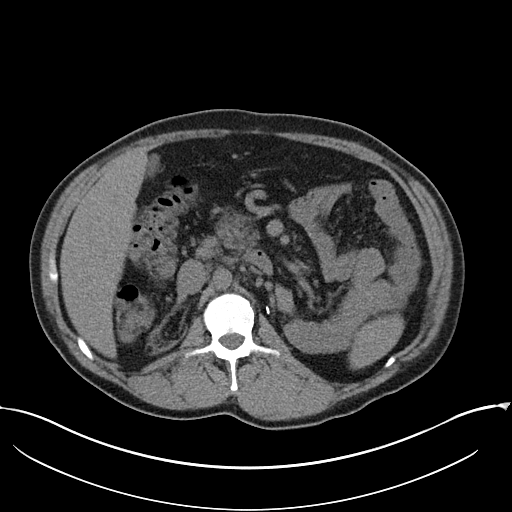
[im 71/94  soft-tissue]
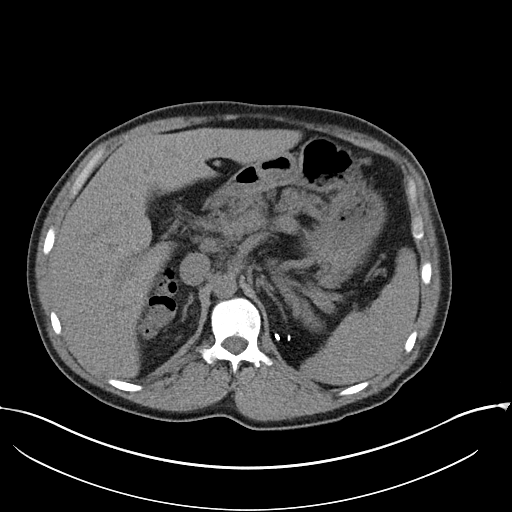
[im 75/94  soft-tissue]
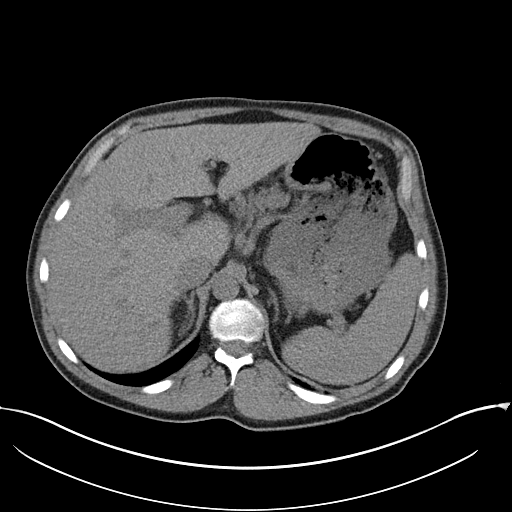
[im 82/94  soft-tissue]
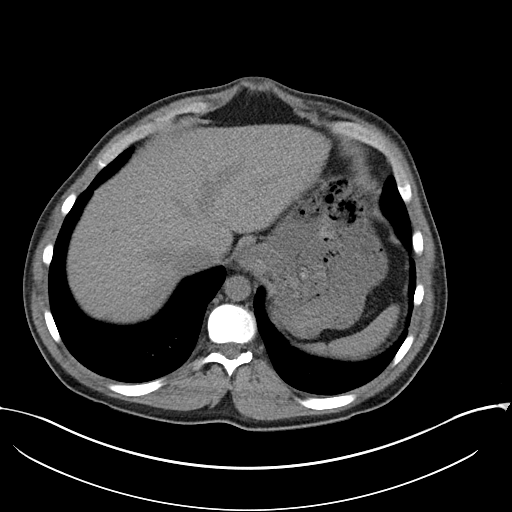
[im 90/94  soft-tissue]
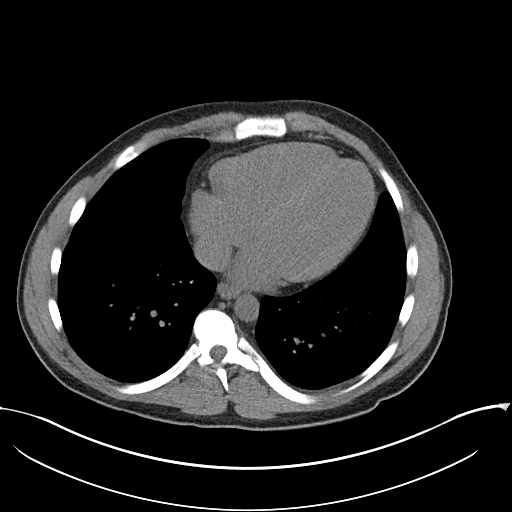

[Series 602: coronals · coronal · 0.92mm/px · 3 of 103 slices shown]
[im 35/103  soft-tissue]
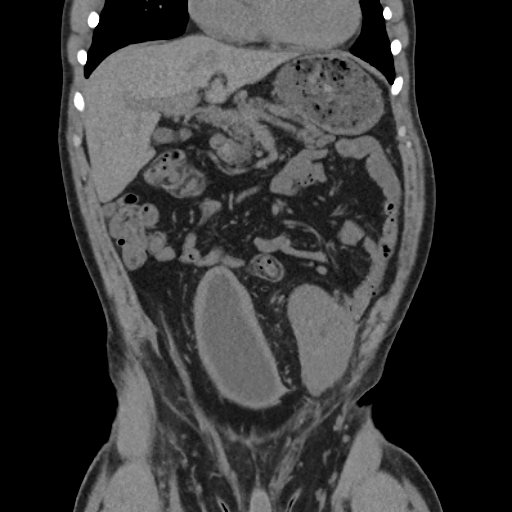
[im 46/103  soft-tissue]
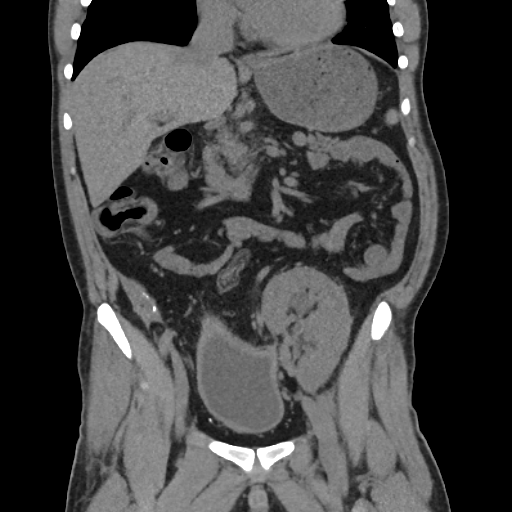
[im 57/103  soft-tissue]
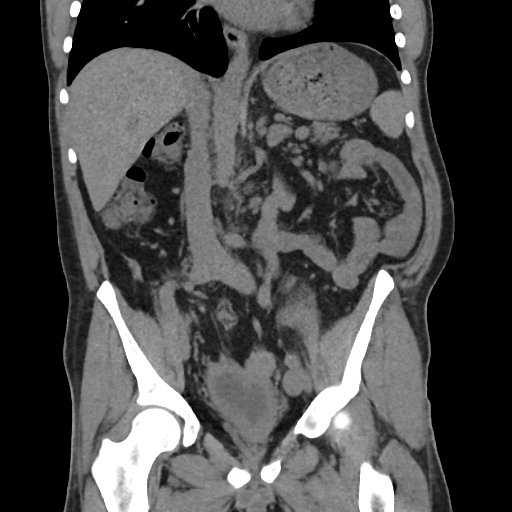

[17 of 46 positions shown; findings below may reference images not displayed]

FINDINGS: The heart is borderline in size. Lung bases are clear. No effusions.

Prior left nephrectomy. Right native kidney severely atrophic. No
hydronephrosis. Liver, gallbladder, spleen, pancreas, adrenals have
an unremarkable unenhanced appearance.

Atrophic right lower quadrant renal transplant. There is a left
lower quadrant renal transplant. No hydronephrosis. No visible
stones. Diffuse wall thickening within the urinary bladder, likely
cystitis.

Bowel grossly unremarkable. No free fluid, free air, or adenopathy.
Aorta is caliber.

No acute bony abnormality.
IMPRESSION: Left lower quadrant renal transplant without hydronephrosis or
stones. Diffuse urinary bladder wall thickening, likely related to
cystitis.
